# Patient Record
Sex: Female | Born: 1980 | Race: White | Hispanic: No | Marital: Married | Smoking: Never smoker
Health system: Southern US, Community
[De-identification: ages and names within clinical notes are randomized; demographics above are authoritative.]

## PROBLEM LIST (undated history)

## (undated) DIAGNOSIS — O26899 Other specified pregnancy related conditions, unspecified trimester: Secondary | ICD-10-CM

## (undated) DIAGNOSIS — R12 Heartburn: Secondary | ICD-10-CM

## (undated) DIAGNOSIS — Z789 Other specified health status: Secondary | ICD-10-CM

## (undated) HISTORY — PX: WISDOM TOOTH EXTRACTION: SHX21

---

## 2002-08-14 ENCOUNTER — Other Ambulatory Visit: Admission: RE | Admit: 2002-08-14 | Discharge: 2002-08-14 | Payer: Self-pay | Admitting: Family Medicine

## 2003-11-19 ENCOUNTER — Other Ambulatory Visit: Admission: RE | Admit: 2003-11-19 | Discharge: 2003-11-19 | Payer: Self-pay | Admitting: Family Medicine

## 2010-11-02 LAB — OB RESULTS CONSOLE GC/CHLAMYDIA
Chlamydia: NEGATIVE
Gonorrhea: NEGATIVE

## 2010-11-02 LAB — OB RESULTS CONSOLE RUBELLA ANTIBODY, IGM: Rubella: IMMUNE

## 2010-11-02 LAB — OB RESULTS CONSOLE ABO/RH

## 2010-11-02 LAB — OB RESULTS CONSOLE ANTIBODY SCREEN: Antibody Screen: NEGATIVE

## 2011-05-08 LAB — OB RESULTS CONSOLE GBS: GBS: POSITIVE

## 2011-05-16 ENCOUNTER — Other Ambulatory Visit: Payer: Self-pay | Admitting: Obstetrics and Gynecology

## 2011-05-16 DIAGNOSIS — N63 Unspecified lump in unspecified breast: Secondary | ICD-10-CM

## 2011-05-17 ENCOUNTER — Ambulatory Visit
Admission: RE | Admit: 2011-05-17 | Discharge: 2011-05-17 | Disposition: A | Discharge status: Home / Self Care | Payer: BC Managed Care – PPO | Source: Ambulatory Visit | Attending: Obstetrics and Gynecology | Admitting: Obstetrics and Gynecology

## 2011-05-17 DIAGNOSIS — N63 Unspecified lump in unspecified breast: Secondary | ICD-10-CM

## 2011-06-07 ENCOUNTER — Inpatient Hospital Stay (HOSPITAL_COMMUNITY)
Admission: AD | Admit: 2011-06-07 | Discharge: 2011-06-10 | DRG: 371 | Disposition: A | Discharge status: Home / Self Care | Payer: BC Managed Care – PPO | Source: Ambulatory Visit | Attending: Obstetrics and Gynecology | Admitting: Obstetrics and Gynecology

## 2011-06-07 ENCOUNTER — Encounter (HOSPITAL_COMMUNITY): Payer: Self-pay | Admitting: *Deleted

## 2011-06-07 DIAGNOSIS — O4290 Premature rupture of membranes, unspecified as to length of time between rupture and onset of labor, unspecified weeks of gestation: Secondary | ICD-10-CM | POA: Diagnosis present

## 2011-06-07 DIAGNOSIS — Z22330 Carrier of Group B streptococcus: Secondary | ICD-10-CM

## 2011-06-07 DIAGNOSIS — Z98891 History of uterine scar from previous surgery: Secondary | ICD-10-CM

## 2011-06-07 DIAGNOSIS — O99892 Other specified diseases and conditions complicating childbirth: Secondary | ICD-10-CM | POA: Diagnosis present

## 2011-06-07 LAB — CBC
HCT: 33.8 % — ABNORMAL LOW (ref 36.0–46.0)
RDW: 14.7 % (ref 11.5–15.5)
WBC: 14.4 10*3/uL — ABNORMAL HIGH (ref 4.0–10.5)

## 2011-06-07 MED ORDER — LACTATED RINGERS IV SOLN
500.0000 mL | Freq: Once | INTRAVENOUS | Status: AC
  Administered 2011-06-07: 500 mL via INTRAVENOUS

## 2011-06-07 MED ORDER — CEFAZOLIN SODIUM 1-5 GM-% IV SOLN
1.0000 g | Freq: Three times a day (TID) | INTRAVENOUS | Status: DC
  Administered 2011-06-07 (×2): 1 g via INTRAVENOUS
  Filled 2011-06-07 (×4): qty 50

## 2011-06-07 MED ORDER — OXYCODONE-ACETAMINOPHEN 5-325 MG PO TABS: 1.0000 | ORAL_TABLET | ORAL | Status: DC | PRN

## 2011-06-07 MED ORDER — OXYTOCIN BOLUS FROM INFUSION
500.0000 mL | Freq: Once | INTRAVENOUS | Status: DC
  Filled 2011-06-07: qty 500

## 2011-06-07 MED ORDER — FENTANYL 2.5 MCG/ML BUPIVACAINE 1/10 % EPIDURAL INFUSION (WH - ANES)
INTRAMUSCULAR | Status: DC | PRN
  Administered 2011-06-07
  Administered 2011-06-07: 14 mL/h via EPIDURAL

## 2011-06-07 MED ORDER — IBUPROFEN 600 MG PO TABS: 600.0000 mg | ORAL_TABLET | Freq: Four times a day (QID) | ORAL | Status: DC | PRN

## 2011-06-07 MED ORDER — OXYTOCIN 20 UNITS IN LACTATED RINGERS INFUSION - SIMPLE: 125.0000 mL/h | Freq: Once | INTRAVENOUS | Status: DC

## 2011-06-07 MED ORDER — FENTANYL 2.5 MCG/ML BUPIVACAINE 1/10 % EPIDURAL INFUSION (WH - ANES)
14.0000 mL/h | INTRAMUSCULAR | Status: DC
  Administered 2011-06-07: 14 mL/h via EPIDURAL
  Filled 2011-06-07 (×3): qty 60

## 2011-06-07 MED ORDER — TERBUTALINE SULFATE 1 MG/ML IJ SOLN: 0.2500 mg | Freq: Once | INTRAMUSCULAR | Status: AC | PRN

## 2011-06-07 MED ORDER — CEFAZOLIN SODIUM-DEXTROSE 2-3 GM-% IV SOLR
2.0000 g | Freq: Once | INTRAVENOUS | Status: AC
  Administered 2011-06-07: 2 g via INTRAVENOUS
  Filled 2011-06-07: qty 50

## 2011-06-07 MED ORDER — BUTORPHANOL TARTRATE 2 MG/ML IJ SOLN
1.0000 mg | Freq: Once | INTRAMUSCULAR | Status: AC
  Administered 2011-06-07: 1 mg via INTRAVENOUS
  Filled 2011-06-07: qty 1

## 2011-06-07 MED ORDER — PHENYLEPHRINE 40 MCG/ML (10ML) SYRINGE FOR IV PUSH (FOR BLOOD PRESSURE SUPPORT)
80.0000 ug | PREFILLED_SYRINGE | INTRAVENOUS | Status: DC | PRN
Start: 2011-06-07 — End: 2011-06-08
  Administered 2011-06-07 (×2): 80 ug via INTRAVENOUS
  Filled 2011-06-07: qty 5

## 2011-06-07 MED ORDER — EPHEDRINE 5 MG/ML INJ
10.0000 mg | INTRAVENOUS | Status: DC | PRN
Start: 2011-06-07 — End: 2011-06-08
  Administered 2011-06-07: 10 mg via INTRAVENOUS
  Filled 2011-06-07: qty 4

## 2011-06-07 MED ORDER — DIPHENHYDRAMINE HCL 50 MG/ML IJ SOLN: 12.5000 mg | INTRAMUSCULAR | Status: DC | PRN

## 2011-06-07 MED ORDER — OXYTOCIN 20 UNITS IN LACTATED RINGERS INFUSION - SIMPLE: 1.0000 m[IU]/min | INTRAVENOUS | Status: DC

## 2011-06-07 MED ORDER — EPHEDRINE 5 MG/ML INJ: 10.0000 mg | INTRAVENOUS | Status: DC | PRN

## 2011-06-07 MED ORDER — LIDOCAINE HCL (PF) 1 % IJ SOLN: 30.0000 mL | INTRAMUSCULAR | Status: DC | PRN

## 2011-06-07 MED ORDER — LACTATED RINGERS IV SOLN
500.0000 mL | INTRAVENOUS | Status: DC | PRN
  Administered 2011-06-07: 500 mL via INTRAVENOUS
  Administered 2011-06-07 (×2): 300 mL via INTRAVENOUS
  Administered 2011-06-07: 500 mL via INTRAVENOUS

## 2011-06-07 MED ORDER — OXYTOCIN 20 UNITS IN LACTATED RINGERS INFUSION - SIMPLE
1.0000 m[IU]/min | INTRAVENOUS | Status: DC
  Administered 2011-06-07: 19 m[IU]/min via INTRAVENOUS
  Administered 2011-06-07: 1 m[IU]/min via INTRAVENOUS
  Filled 2011-06-07: qty 1000

## 2011-06-07 MED ORDER — ACETAMINOPHEN 325 MG PO TABS: 650.0000 mg | ORAL_TABLET | ORAL | Status: DC | PRN

## 2011-06-07 MED ORDER — LACTATED RINGERS IV SOLN
INTRAVENOUS | Status: DC
  Administered 2011-06-07 – 2011-06-08 (×4): via INTRAVENOUS

## 2011-06-07 MED ORDER — SODIUM BICARBONATE 8.4 % IV SOLN
INTRAVENOUS | Status: DC | PRN
  Administered 2011-06-07: 4 mL via EPIDURAL

## 2011-06-07 MED ORDER — CITRIC ACID-SODIUM CITRATE 334-500 MG/5ML PO SOLN
30.0000 mL | ORAL | Status: DC | PRN
  Administered 2011-06-08: 30 mL via ORAL
  Filled 2011-06-07: qty 15

## 2011-06-07 MED ORDER — ONDANSETRON HCL 4 MG/2ML IJ SOLN: 4.0000 mg | Freq: Four times a day (QID) | INTRAMUSCULAR | Status: DC | PRN

## 2011-06-07 MED ORDER — PHENYLEPHRINE 40 MCG/ML (10ML) SYRINGE FOR IV PUSH (FOR BLOOD PRESSURE SUPPORT): 80.0000 ug | PREFILLED_SYRINGE | INTRAVENOUS | Status: DC | PRN

## 2011-06-07 NOTE — Progress Notes (Signed)
Order to restart Pitocin at 75milliunit/min

## 2011-06-07 NOTE — Progress Notes (Signed)
Patient ID: Tammie Atkinson, female   DOB: 01-18-1980, 31 y.o.   MRN: [id number] Without pitocin the FHR looks normal but contractions are 6 minutes apart. Will restart pitocin at 1 mu/minute.

## 2011-06-07 NOTE — Progress Notes (Signed)
Patient ID: Tammie Atkinson, female   DOB: Sep 20, 1980, 31 y.o.   MRN: [id number] She has begun to have decelerations again Pitocin has been stopped Cervix 2 cm 80 % effaced and the vertex is at -1/-2 station The failure of the baby to tolerate good contractions is making C section even more likely.

## 2011-06-07 NOTE — H&P (Signed)
NAMESTINA, GANE              ACCOUNT NO.:  [id number]  MEDICAL RECORD NO.:  [id number]  LOCATION:  9171                          FACILITY:  [facility]  PHYSICIAN:  Malachi Pro. Ambrose Mantle, M.D. DATE OF BIRTH:  Jan 10, 1980  DATE OF ADMISSION:  06/07/2011 DATE OF DISCHARGE:                             HISTORY & PHYSICAL   PRESENT ILLNESS:  This is a 31 year old white female, para 0, gravida 1, EDC June 06, 2011 by an ultrasound done at 7 weeks and 5 days on October 23, 2010.  Blood group and type B positive, negative antibody.  Pap smear normal.  Rubella immune.  RPR nonreactive.  Urine culture negative.  Hepatitis B surface antigen negative, HIV negative, GC and Chlamydia negative.  1 hour Glucola normal.  Group B strep positive. This patient had a relatively benign prenatal course and had a spontaneous rupture of the membranes at 3 a.m. on June 07, 2011.  She came to the hospital.  Rupture of membranes was confirmed and she was admitted.  Ultrasound on October 23, 2010, showed an average ultrasound age of 7 weeks and 5 days with an [facility] of June 06, 2011.  Ultrasound on January 08, 2011, gave her an average ultrasound age of 29 weeks and 3 days with an [facility] of June 08, 2011.  Ultrasound was normal.  As stated the prenatal course was uncomplicated until spontaneous rupture of membranes today.  PAST MEDICAL HISTORY:  Reveals medical history of allergic rhinitis due to pollen, dry skin, fractured ankle at age 37, and a history of gonorrhea.  SURGICAL HISTORY:  Wisdom teeth extraction at age 9.  ALLERGIES:  PENICILLIN as an infant gave hives.  She has no food allergies and no latex allergy.  FAMILY HISTORY:  Mother has a history of depression and arthritis. Maternal grandmother has arthritis, colon cancer, diabetes, hypertension.  Her paternal grandfather has bone cancer and lung cancer. Her brother has asthma.  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  She denies tobacco, alcohol, and illicit substance abuse.  PHYSICAL EXAMINATION:  VITAL SIGNS:  On admission, temperature is 98.4, pulse 93, respirations 20, blood pressure 111/59. HEART:  Normal size and sounds.  No murmurs. LUNGS:  Clear to auscultation. ABDOMEN:  Soft and nontender.  Fundal height on May 24, 2011, was 37 cm. Fetal heart tones are normal.  The cervix is long and closed. Presenting part is -4 thought to be vertex. Rupture of membranes has been confirmed.  ADMITTING IMPRESSION:  Intrauterine pregnancy at 40 weeks and 1 day with premature rupture of the membranes, and positive group B strep.  The patient has been started on Pitocin and Ancef for the positive group B strep.  She has been informed that everything will be done to try to effect a vaginal delivery, but the baby feels fairly large, the cervix is extremely unfavorable, and her pelvis seems somewhat narrow.  At the present, Pitocin is at 3 milliunits a minute, her contractions are every 3-5 minutes.     Malachi Pro. Ambrose Mantle, M.D.     TFH/MEDQ  D:  06/07/2011  T:  06/07/2011  Job:  [id number]

## 2011-06-07 NOTE — Anesthesia Preprocedure Evaluation (Signed)

## 2011-06-07 NOTE — Progress Notes (Signed)
Patient ID: Tammie Atkinson, female   DOB: 01-20-1980, 31 y.o.   MRN: [id number] The RN stopped the pitocin because of some decelerations. I advised restarting it at 1/2 the prior dose. She began having some late decelerations at 6:48 PM. I examined her and found the cervix to be FT 70-80% effaced and the vertex at -1/-2 station. The pt had a deep deceleration that corrected with O2, position change and stopping the pitocin We will watch for progress without pitocin for now.

## 2011-06-07 NOTE — Anesthesia Procedure Notes (Signed)

## 2011-06-07 NOTE — MAU Note (Signed)
PT SAYS SHE WAS ASLEEP- BUT  AWOKE AT 0300-   FEELING WET- TO B-ROOM- FLUID CONTINUED TO TRICKLE OUT..   DENIES HSV AND MRSA.

## 2011-06-07 NOTE — Progress Notes (Signed)
Dr Ambrose Mantle phoned to check on pt. Updated on FHR, contraction pattern,temp and maternal HR. Order to restart Pitocin at 5 milliunits/min.

## 2011-06-08 ENCOUNTER — Encounter (HOSPITAL_COMMUNITY): Payer: Self-pay | Admitting: Anesthesiology

## 2011-06-08 ENCOUNTER — Inpatient Hospital Stay (HOSPITAL_COMMUNITY): Payer: BC Managed Care – PPO | Admitting: Anesthesiology

## 2011-06-08 ENCOUNTER — Encounter (HOSPITAL_COMMUNITY): Payer: Self-pay | Admitting: *Deleted

## 2011-06-08 ENCOUNTER — Encounter (HOSPITAL_COMMUNITY)
Admission: AD | Disposition: A | Discharge status: Home / Self Care | Payer: Self-pay | Source: Ambulatory Visit | Attending: Obstetrics and Gynecology

## 2011-06-08 SURGERY — Surgical Case
Anesthesia: Regional | Site: Abdomen | Wound class: Clean Contaminated

## 2011-06-08 MED ORDER — ACETAMINOPHEN 10 MG/ML IV SOLN
1000.0000 mg | Freq: Four times a day (QID) | INTRAVENOUS | Status: AC | PRN
  Filled 2011-06-08: qty 100

## 2011-06-08 MED ORDER — SODIUM CHLORIDE 0.9 % IV SOLN
1.0000 ug/kg/h | INTRAVENOUS | Status: DC | PRN
  Filled 2011-06-08: qty 2.5

## 2011-06-08 MED ORDER — CEFAZOLIN SODIUM-DEXTROSE 2-3 GM-% IV SOLR
2.0000 g | Freq: Three times a day (TID) | INTRAVENOUS | Status: AC
  Administered 2011-06-08: 2 g via INTRAVENOUS
  Filled 2011-06-08: qty 50

## 2011-06-08 MED ORDER — METOCLOPRAMIDE HCL 5 MG/ML IJ SOLN: 10.0000 mg | Freq: Three times a day (TID) | INTRAMUSCULAR | Status: DC | PRN

## 2011-06-08 MED ORDER — TETANUS-DIPHTH-ACELL PERTUSSIS 5-2.5-18.5 LF-MCG/0.5 IM SUSP: 0.5000 mL | Freq: Once | INTRAMUSCULAR | Status: DC

## 2011-06-08 MED ORDER — OXYTOCIN 10 UNIT/ML IJ SOLN
20.0000 [IU] | INTRAVENOUS | Status: DC | PRN
  Administered 2011-06-08: 20 [IU] via INTRAVENOUS

## 2011-06-08 MED ORDER — ONDANSETRON HCL 4 MG/2ML IJ SOLN: 4.0000 mg | INTRAMUSCULAR | Status: DC | PRN

## 2011-06-08 MED ORDER — MORPHINE SULFATE 0.5 MG/ML IJ SOLN
INTRAMUSCULAR | Status: AC
  Filled 2011-06-08: qty 10

## 2011-06-08 MED ORDER — OXYTOCIN 20 UNITS IN LACTATED RINGERS INFUSION - SIMPLE
INTRAVENOUS | Status: AC
  Administered 2011-06-08: 20 [IU]
  Filled 2011-06-08: qty 1000

## 2011-06-08 MED ORDER — OXYTOCIN 10 UNIT/ML IJ SOLN
INTRAMUSCULAR | Status: AC
  Filled 2011-06-08: qty 2

## 2011-06-08 MED ORDER — KETOROLAC TROMETHAMINE 30 MG/ML IJ SOLN
30.0000 mg | Freq: Four times a day (QID) | INTRAMUSCULAR | Status: AC | PRN
  Administered 2011-06-08: 30 mg via INTRAVENOUS

## 2011-06-08 MED ORDER — SIMETHICONE 80 MG PO CHEW
80.0000 mg | CHEWABLE_TABLET | Freq: Three times a day (TID) | ORAL | Status: DC
  Administered 2011-06-08 – 2011-06-10 (×9): 80 mg via ORAL

## 2011-06-08 MED ORDER — LACTATED RINGERS IV SOLN: INTRAVENOUS | Status: DC

## 2011-06-08 MED ORDER — DIPHENHYDRAMINE HCL 50 MG/ML IJ SOLN: 12.5000 mg | INTRAMUSCULAR | Status: DC | PRN

## 2011-06-08 MED ORDER — SODIUM BICARBONATE 8.4 % IV SOLN
INTRAVENOUS | Status: AC
  Filled 2011-06-08: qty 50

## 2011-06-08 MED ORDER — ONDANSETRON HCL 4 MG/2ML IJ SOLN: 4.0000 mg | Freq: Three times a day (TID) | INTRAMUSCULAR | Status: DC | PRN

## 2011-06-08 MED ORDER — LIDOCAINE-EPINEPHRINE 2 %-1:100000 IJ SOLN
INTRAMUSCULAR | Status: DC | PRN
  Administered 2011-06-08 (×3): 5 mL via INTRADERMAL

## 2011-06-08 MED ORDER — MEASLES, MUMPS & RUBELLA VAC ~~LOC~~ INJ
0.5000 mL | INJECTION | Freq: Once | SUBCUTANEOUS | Status: DC
  Filled 2011-06-08: qty 0.5

## 2011-06-08 MED ORDER — IBUPROFEN 600 MG PO TABS
600.0000 mg | ORAL_TABLET | Freq: Four times a day (QID) | ORAL | Status: DC
  Administered 2011-06-08 – 2011-06-10 (×8): 600 mg via ORAL
  Filled 2011-06-08 (×8): qty 1

## 2011-06-08 MED ORDER — MEPERIDINE HCL 25 MG/ML IJ SOLN: 6.2500 mg | INTRAMUSCULAR | Status: DC | PRN

## 2011-06-08 MED ORDER — ONDANSETRON HCL 4 MG/2ML IJ SOLN
INTRAMUSCULAR | Status: AC
  Filled 2011-06-08: qty 2

## 2011-06-08 MED ORDER — MORPHINE SULFATE (PF) 0.5 MG/ML IJ SOLN
INTRAMUSCULAR | Status: DC | PRN
  Administered 2011-06-08: 4 mg via EPIDURAL

## 2011-06-08 MED ORDER — WITCH HAZEL-GLYCERIN EX PADS: 1.0000 "application " | MEDICATED_PAD | CUTANEOUS | Status: DC | PRN

## 2011-06-08 MED ORDER — DIBUCAINE 1 % RE OINT: 1.0000 "application " | TOPICAL_OINTMENT | RECTAL | Status: DC | PRN

## 2011-06-08 MED ORDER — DIPHENHYDRAMINE HCL 50 MG/ML IJ SOLN: 25.0000 mg | INTRAMUSCULAR | Status: DC | PRN

## 2011-06-08 MED ORDER — ADULT MULTIVITAMIN W/MINERALS CH
1.0000 | ORAL_TABLET | Freq: Every day | ORAL | Status: DC
  Administered 2011-06-08 – 2011-06-10 (×3): 1 via ORAL
  Filled 2011-06-08 (×5): qty 1

## 2011-06-08 MED ORDER — MENTHOL 3 MG MT LOZG: 1.0000 | LOZENGE | OROMUCOSAL | Status: DC | PRN

## 2011-06-08 MED ORDER — ONDANSETRON HCL 4 MG PO TABS: 4.0000 mg | ORAL_TABLET | ORAL | Status: DC | PRN

## 2011-06-08 MED ORDER — OXYTOCIN 20 UNITS IN LACTATED RINGERS INFUSION - SIMPLE: 125.0000 mL/h | INTRAVENOUS | Status: AC

## 2011-06-08 MED ORDER — FENTANYL CITRATE 0.05 MG/ML IJ SOLN: 25.0000 ug | INTRAMUSCULAR | Status: DC | PRN

## 2011-06-08 MED ORDER — OXYCODONE-ACETAMINOPHEN 5-325 MG PO TABS
1.0000 | ORAL_TABLET | ORAL | Status: DC | PRN
  Administered 2011-06-09 – 2011-06-10 (×4): 1 via ORAL
  Filled 2011-06-08 (×4): qty 1

## 2011-06-08 MED ORDER — LIDOCAINE-EPINEPHRINE (PF) 2 %-1:200000 IJ SOLN
INTRAMUSCULAR | Status: AC
  Filled 2011-06-08: qty 20

## 2011-06-08 MED ORDER — OXYTOCIN 10 UNIT/ML IJ SOLN
20.0000 [IU] | INTRAMUSCULAR | Status: DC
  Filled 2011-06-08: qty 2

## 2011-06-08 MED ORDER — ONDANSETRON HCL 4 MG/2ML IJ SOLN
INTRAMUSCULAR | Status: DC | PRN
  Administered 2011-06-08: 2 mg via INTRAVENOUS

## 2011-06-08 MED ORDER — ZOLPIDEM TARTRATE 5 MG PO TABS: 5.0000 mg | ORAL_TABLET | Freq: Every evening | ORAL | Status: DC | PRN

## 2011-06-08 MED ORDER — SODIUM CHLORIDE 0.9 % IJ SOLN: 3.0000 mL | INTRAMUSCULAR | Status: DC | PRN

## 2011-06-08 MED ORDER — NALBUPHINE HCL 10 MG/ML IJ SOLN
5.0000 mg | INTRAMUSCULAR | Status: DC | PRN
  Filled 2011-06-08: qty 1

## 2011-06-08 MED ORDER — DIPHENHYDRAMINE HCL 25 MG PO CAPS: 25.0000 mg | ORAL_CAPSULE | ORAL | Status: DC | PRN

## 2011-06-08 MED ORDER — PROMETHAZINE HCL 25 MG/ML IJ SOLN: 6.2500 mg | INTRAMUSCULAR | Status: DC | PRN

## 2011-06-08 MED ORDER — CEFAZOLIN SODIUM 1-5 GM-% IV SOLN
1.0000 g | Freq: Three times a day (TID) | INTRAVENOUS | Status: AC
  Administered 2011-06-08: 1 g via INTRAVENOUS
  Filled 2011-06-08 (×2): qty 50

## 2011-06-08 MED ORDER — KETOROLAC TROMETHAMINE 30 MG/ML IJ SOLN: 30.0000 mg | Freq: Four times a day (QID) | INTRAMUSCULAR | Status: AC | PRN

## 2011-06-08 MED ORDER — LANOLIN HYDROUS EX OINT: 1.0000 "application " | TOPICAL_OINTMENT | CUTANEOUS | Status: DC | PRN

## 2011-06-08 MED ORDER — SENNOSIDES-DOCUSATE SODIUM 8.6-50 MG PO TABS
2.0000 | ORAL_TABLET | Freq: Every day | ORAL | Status: DC
  Administered 2011-06-09: 2 via ORAL

## 2011-06-08 MED ORDER — NALOXONE HCL 0.4 MG/ML IJ SOLN: 0.4000 mg | INTRAMUSCULAR | Status: DC | PRN

## 2011-06-08 MED ORDER — ACETAMINOPHEN 325 MG PO TABS: 325.0000 mg | ORAL_TABLET | ORAL | Status: DC | PRN

## 2011-06-08 MED ORDER — PHENYLEPHRINE HCL 10 MG/ML IJ SOLN
INTRAMUSCULAR | Status: DC | PRN
  Administered 2011-06-08: 80 ug via INTRAVENOUS
  Administered 2011-06-08: 120 ug via INTRAVENOUS
  Administered 2011-06-08: 80 ug via INTRAVENOUS
  Administered 2011-06-08: 100 ug via INTRAVENOUS
  Administered 2011-06-08: 80 ug via INTRAVENOUS
  Administered 2011-06-08: 120 ug via INTRAVENOUS

## 2011-06-08 MED ORDER — SCOPOLAMINE 1 MG/3DAYS TD PT72
1.0000 | MEDICATED_PATCH | Freq: Once | TRANSDERMAL | Status: DC
  Administered 2011-06-08: 1.5 mg via TRANSDERMAL

## 2011-06-08 MED ORDER — LACTATED RINGERS IV SOLN
INTRAVENOUS | Status: DC | PRN
  Administered 2011-06-08 (×2): via INTRAVENOUS

## 2011-06-08 MED ORDER — MIDAZOLAM HCL 2 MG/2ML IJ SOLN: 0.5000 mg | Freq: Once | INTRAMUSCULAR | Status: DC | PRN

## 2011-06-08 MED ORDER — DIPHENHYDRAMINE HCL 25 MG PO CAPS: 25.0000 mg | ORAL_CAPSULE | Freq: Four times a day (QID) | ORAL | Status: DC | PRN

## 2011-06-08 MED ORDER — SIMETHICONE 80 MG PO CHEW: 80.0000 mg | CHEWABLE_TABLET | ORAL | Status: DC | PRN

## 2011-06-08 SURGICAL SUPPLY — 31 items
CLOTH BEACON ORANGE TIMEOUT ST (SAFETY) ×2 IMPLANT
CONTAINER PREFILL 10% NBF 15ML (MISCELLANEOUS) IMPLANT
DRESSING TELFA 8X3 (GAUZE/BANDAGES/DRESSINGS) IMPLANT
DRSG COVADERM 4X10 (GAUZE/BANDAGES/DRESSINGS) ×2 IMPLANT
DRSG VASELINE 3X18 (GAUZE/BANDAGES/DRESSINGS) ×2 IMPLANT
ELECT REM PT RETURN 9FT ADLT (ELECTROSURGICAL) ×2
ELECTRODE REM PT RTRN 9FT ADLT (ELECTROSURGICAL) ×1 IMPLANT
EXTRACTOR VACUUM KIWI (MISCELLANEOUS) IMPLANT
EXTRACTOR VACUUM M CUP 4 TUBE (SUCTIONS) IMPLANT
GAUZE SPONGE 4X4 12PLY STRL LF (GAUZE/BANDAGES/DRESSINGS) ×4 IMPLANT
GLOVE BIO SURGEON STRL SZ7.5 (GLOVE) ×4 IMPLANT
GOWN PREVENTION PLUS LG XLONG (DISPOSABLE) ×4 IMPLANT
GOWN PREVENTION PLUS XLARGE (GOWN DISPOSABLE) ×2 IMPLANT
KIT ABG SYR 3ML LUER SLIP (SYRINGE) IMPLANT
NEEDLE HYPO 25X5/8 SAFETYGLIDE (NEEDLE) IMPLANT
NS IRRIG 1000ML POUR BTL (IV SOLUTION) ×2 IMPLANT
PACK C SECTION WH (CUSTOM PROCEDURE TRAY) ×2 IMPLANT
PAD ABD 7.5X8 STRL (GAUZE/BANDAGES/DRESSINGS) IMPLANT
RTRCTR C-SECT PINK 25CM LRG (MISCELLANEOUS) IMPLANT
SLEEVE SCD COMPRESS KNEE MED (MISCELLANEOUS) IMPLANT
STAPLER VISISTAT 35W (STAPLE) ×2 IMPLANT
SUT PLAIN 0 NONE (SUTURE) IMPLANT
SUT VIC AB 0 CT1 36 (SUTURE) ×14 IMPLANT
SUT VIC AB 3-0 CTX 36 (SUTURE) ×2 IMPLANT
SUT VIC AB 3-0 SH 27 (SUTURE)
SUT VIC AB 3-0 SH 27X BRD (SUTURE) IMPLANT
SUT VIC AB 4-0 KS 27 (SUTURE) IMPLANT
SUT VICRYL 0 TIES 12 18 (SUTURE) IMPLANT
TOWEL OR 17X24 6PK STRL BLUE (TOWEL DISPOSABLE) ×6 IMPLANT
TRAY FOLEY CATH 14FR (SET/KITS/TRAYS/PACK) IMPLANT
WATER STERILE IRR 1000ML POUR (IV SOLUTION) ×2 IMPLANT

## 2011-06-08 NOTE — Anesthesia Postprocedure Evaluation (Signed)
  Anesthesia Post-op Note  Patient: Tammie Atkinson  Procedure(s) Performed: Procedure(s) (LRB): CESAREAN SECTION (N/A)  Patient Location: Mother/Baby  Anesthesia Type: Epidural  Level of Consciousness: awake  Airway and Oxygen Therapy: Patient Spontanous Breathing  Post-op Pain: mild  Post-op Assessment: Patient's Cardiovascular Status Stable and Respiratory Function Stable  Post-op Vital Signs: stable  Complications: No apparent anesthesia complications 

## 2011-06-08 NOTE — Progress Notes (Signed)
Patient ID: Tammie Atkinson, female   DOB: 06-Feb-1980, 31 y.o.   MRN: [id number] Pitocin is at 1 mu/ minute and the contractions are infrequent The cervix is 3+ cm 100 % effaced and the vertex is at -1 station.We will begin to increase rhe pitocin to see if the baby will tolerate the contractions.

## 2011-06-08 NOTE — Anesthesia Postprocedure Evaluation (Signed)
Anesthesia Post Note  Patient: Tammie Atkinson  Procedure(s) Performed: Procedure(s) (LRB): CESAREAN SECTION (N/A)  Anesthesia type: Epidural  Patient location: PACU  Post pain: Pain level controlled  Post assessment: Post-op Vital signs reviewed  Last Vitals:  Filed Vitals:   06/08/11 0500  BP: 104/61  Pulse: 103  Temp: 37.7 C  Resp: 25    Post vital signs: Reviewed  Level of consciousness: awake  Complications: No apparent anesthesia complications

## 2011-06-08 NOTE — Transfer of Care (Signed)
Immediate Anesthesia Transfer of Care Note  Patient: Tammie Atkinson  Procedure(s) Performed: Procedure(s) (LRB): CESAREAN SECTION (N/A)  Patient Location: PACU  Anesthesia Type: Epidural  Level of Consciousness: awake, alert  and oriented  Airway & Oxygen Therapy: Patient Spontanous Breathing  Post-op Assessment: Report given to PACU RN  Post vital signs: Reviewed  Complications: No apparent anesthesia complications

## 2011-06-08 NOTE — Progress Notes (Signed)
Patient ID: Tammie Atkinson, female   DOB: 1980/10/28, 31 y.o.   MRN: [id number] DOS afebrile BP normal no complaints.

## 2011-06-08 NOTE — Op Note (Signed)
Operative note on Tammie Atkinson:  Date of the operation: 06/08/2011  Preoperative diagnosis intrauterine pregnancy 40+ weeks, fetal intolerance of labor  Postoperative diagnosis: Same   Operation: Low transverse cervical C-section  Operator: Ameirah Khatoon  Anesthesia: Epidural Dr. Brayton Caves  The patient was brought to the operating room. The epidural had been boosted. Anesthesia was confirmed. A timeout was done. The lower abdomen was prepped with Betadine solution and draped as a sterile field. A Foley catheter was indwelling. The fetal scalp electrode had been removed just prior to prepping. The area was draped as a sterile field and anesthesia was confirmed by pinching the lower abdomen with an Allis clamp. A transverse incision was made and carried in layers through the skin subcutaneous tissue and fascia. Several bleeders were ligated with the Bovie and one bleeder had to be clamped and bovied. The fascia was incised laterally and the rectus muscle was split in the midline and the peritoneum was opened vertically.An Alexis retractor was placed to expose the lower uterine segment. A short transverse incision was made through the superficial layers of the myometrium. I entered the amniotic sac with my finger. The incision was enlarged by pulling superiorly and inferiorly on the incision. The vertex was OP. The vertex was easily delivered through the incisional opening, the nose and pharynx were suctioned with the bulb, and the remainder of the baby was delivered easily. The infant was female and she cried just after birth. The infant's weight is pending and I do not have the Apgars. Dr. Marthann Schiller, a neonatologist, took care of the baby Routine cord blood studies were obtained and the placenta was removed intact. The inside of the uterus was inspected and found to be free of any debris. The uterine incision was then closed in 2 layers using a running lock suture of 0 Vicryl the first layer, and  nonlocking suture of the same material on the second layer. The uterus both tubes and ovaries appeared normal. Hemostasis was adequate and liberal irrigation confirmed hemostasis and the gutters were free of any blood. The retractor was removed and the abdominal wall was closed in layers using interrupted sutures of 0 Vicryl on rectus muscle and peritoneum in one layer,2 running sutures of 0 Vicryl on the fascia, a running 3-0 Vicryl in the subcutaneous tissue, and staples on the skin. The patient seemed to tolerate the procedure well. Blood loss was estimated at 1000 cc. Sponge and needle counts were correct and she was returned to recovery in satisfactory condition

## 2011-06-08 NOTE — Addendum Note (Signed)
Addendum  created 06/08/11 1432 by Renford Dills, CRNA   Modules edited:Notes Section

## 2011-06-08 NOTE — Progress Notes (Signed)
Patient ID: Tammie Atkinson, female   DOB: 02-Mar-1980, 31 y.o.   MRN: [id number] The fetus continues to be intolerant of labor. Even with 3 mu/ minute of pitocin decelerations recurred and the cervix is unchanged Wiill proceed with c section

## 2011-06-09 LAB — CBC
MCV: 84.2 fL (ref 78.0–100.0)
Platelets: 202 10*3/uL (ref 150–400)
RDW: 14.9 % (ref 11.5–15.5)
WBC: 15.3 10*3/uL — ABNORMAL HIGH (ref 4.0–10.5)

## 2011-06-09 NOTE — Addendum Note (Signed)
Addendum  created 06/09/11 0806 by Renford Dills, CRNA   Modules edited:Notes Section

## 2011-06-09 NOTE — Anesthesia Postprocedure Evaluation (Signed)
  Anesthesia Post-op Note  Patient: Tammie Atkinson  Procedure(s) Performed: Procedure(s) (LRB): CESAREAN SECTION (N/A)  Patient Location: Mother/Baby  Anesthesia Type: Epidural  Level of Consciousness: awake  Airway and Oxygen Therapy: Patient Spontanous Breathing  Post-op Pain: mild  Post-op Assessment: Patient's Cardiovascular Status Stable and Respiratory Function Stable  Post-op Vital Signs: stable  Complications: No apparent anesthesia complications

## 2011-06-09 NOTE — Progress Notes (Signed)
Subjective: Postpartum Day 1 Cesarean Delivery Patient reports tolerating PO, + flatus and no problems voiding.    Objective: Vital signs in last 24 hours: Temp:  [98 F (36.7 C)-99.4 F (37.4 C)] 98 F (36.7 C) (06/08 0211) Pulse Rate:  [87-107] 87  (06/08 0211) Resp:  [17-20] 18  (06/08 0211) BP: (91-117)/(54-69) 105/61 mmHg (06/08 0211) SpO2:  [93 %-97 %] 94 % (06/07 2150)  Physical Exam:  General: alert and cooperative Lochia: appropriate Uterine Fundus: firm Incision: healing well    Basename 06/09/11 0530 06/07/11 0600  HGB 9.2* 10.4*  HCT 30.4* 33.8*    Assessment/Plan: Status post Cesarean section. Doing well postoperatively.  Continue current care.  Oliver Pila 06/09/2011, 9:43 AM

## 2011-06-10 ENCOUNTER — Encounter (HOSPITAL_COMMUNITY): Payer: Self-pay | Admitting: Obstetrics and Gynecology

## 2011-06-10 MED ORDER — IBUPROFEN 600 MG PO TABS: 600.0000 mg | ORAL_TABLET | Freq: Four times a day (QID) | ORAL | Status: AC

## 2011-06-10 MED ORDER — OXYCODONE-ACETAMINOPHEN 5-325 MG PO TABS: 1.0000 | ORAL_TABLET | ORAL | Status: AC | PRN

## 2011-06-10 NOTE — Discharge Summary (Signed)
Obstetric Discharge Summary Reason for Admission: rupture of membranes Prenatal Procedures: none Intrapartum Procedures: cesarean: low cervical, transverse Postpartum Procedures: none Complications-Operative and Postpartum: none Hemoglobin  Date Value Range Status  06/09/2011 9.2* 12.0-15.0 (g/dL) Final     HCT  Date Value Range Status  06/09/2011 30.4* 36.0-46.0 (%) Final    Physical Exam:  General: alert and cooperative Lochia: appropriate Uterine Fundus: firm Incision: healing well, no significant erythema   Discharge Diagnoses: Term Pregnancy-delivered                                         Non-reassuring FHR  Discharge Information: Date: 06/10/2011 Activity: pelvic rest Diet: routine Medications: Ibuprofen and Percocet Condition: improved Instructions: refer to practice specific booklet Discharge to: home   Newborn Data: Live born female  Birth Weight: 7 lb 13.9 oz (3569 g) APGAR: 9, 9  Home with mother.  Oliver Pila 06/10/2011, 10:07 AM

## 2011-06-10 NOTE — Progress Notes (Signed)
Subjective: Postpartum Day 2 Cesarean Delivery Patient reports tolerating PO, + flatus and no problems voiding.  Requests early d/c  Objective: Vital signs in last 24 hours: Temp:  [98.1 F (36.7 C)-98.4 F (36.9 C)] 98.1 F (36.7 C) (06/09 0625) Pulse Rate:  [87-97] 87  (06/09 0625) Resp:  [18] 18  (06/09 0625) BP: (109-117)/(73-76) 109/76 mmHg (06/09 1610)  Physical Exam:  General: alert and cooperative Lochia: appropriate Uterine Fundus: firm Incision: healing well, no significant erythema, staples intact    Basename 06/09/11 0530  HGB 9.2*  HCT 30.4*    Assessment/Plan: Status post Cesarean section. Doing well postoperatively.  D/c home F/u office in 3-4 days for staple removal Motrin and percocet  Merlene Dante W 06/10/2011, 10:03 AM

## 2012-03-12 ENCOUNTER — Ambulatory Visit (INDEPENDENT_AMBULATORY_CARE_PROVIDER_SITE_OTHER): Payer: BC Managed Care – PPO | Admitting: Family Medicine

## 2012-03-12 VITALS — BP 108/82 | HR 81 | Temp 98.1°F | Resp 16 | Ht 64.0 in | Wt 144.0 lb

## 2012-03-12 DIAGNOSIS — R0981 Nasal congestion: Secondary | ICD-10-CM

## 2012-03-12 DIAGNOSIS — J3489 Other specified disorders of nose and nasal sinuses: Secondary | ICD-10-CM

## 2012-03-12 DIAGNOSIS — R52 Pain, unspecified: Secondary | ICD-10-CM

## 2012-03-12 DIAGNOSIS — J029 Acute pharyngitis, unspecified: Secondary | ICD-10-CM

## 2012-03-12 LAB — POCT RAPID STREP A (OFFICE): Rapid Strep A Screen: NEGATIVE

## 2012-03-12 LAB — POCT INFLUENZA A/B: Influenza B, POC: NEGATIVE

## 2012-03-12 NOTE — Addendum Note (Signed)
Addended by: Eddie Candle on: 03/12/2012 03:19 PM   Modules accepted: Orders

## 2012-03-12 NOTE — Progress Notes (Signed)
[facility] [address], [place] [address] [phone]  Date:  03/12/2012   Name:  Tammie Atkinson   DOB:  [birth year]   MRN:  [id number]  PCP:  No primary provider on file.    Chief Complaint: Sore Throat and Headache   History of Present Illness:  Tammie Atkinson is a 32 y.o. very pleasant female patient who presents with the following:  She is here with a sore throat, stuffy nose and body aches over the last 24 hours or so.   She does have a cough.  She has not noted a fever.  No GI symptoms She does have a runny and stuffy nose, and some sneezing.  She does have an earache  She delivered a daughter last summer- she is still BF her daughter. 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  She and her daughter both had their flu shots this year.  She is generally healthy and does not have any chronic health problems.    There is no problem list on file for this patient.   History reviewed. No pertinent past medical history.  Past Surgical History  Procedure Laterality Date  . Wisdom tooth extraction    . Cesarean section  06/08/2011    Procedure: CESAREAN SECTION;  Surgeon: Bing Plume, MD;  Location: [facility];  Service: Gynecology;  Laterality: N/A;    History  Substance Use Topics  . Smoking status: Never Smoker   . Smokeless tobacco: Never Used  . Alcohol Use: No    Family History  Problem Relation Age of Onset  . Cancer Maternal Grandmother   . Diabetes Maternal Grandmother   . Cancer Paternal Grandfather     Allergies  Allergen Reactions  . Penicillins     SAYS TOOK AS A CHILD     Medication list has been reviewed and updated.  Current Outpatient Prescriptions on File Prior to Visit  Medication Sig Dispense Refill  . calcium carbonate (TUMS - DOSED IN MG ELEMENTAL CALCIUM) 500 MG chewable tablet Chew 1 tablet by mouth daily. For heartburn.      . Multiple Vitamin (MULTIVITAMIN) tablet Take 1  tablet by mouth daily.       No current facility-administered medications on file prior to visit.    Review of Systems:  As per HPI- otherwise negative.   Physical Examination: Filed Vitals:   03/12/12 1256  BP: 108/82  Pulse: 81  Temp: 98.1 F (36.7 C)  Resp: 16   Filed Vitals:   03/12/12 1256  Height: 5\' 4"  (1.626 m)  Weight: 144 lb (65.318 kg)   Body mass index is 24.71 kg/(m^2). Ideal Body Weight: Weight in (lb) to have BMI = 25: 145.3  GEN: WDWN, NAD, Non-toxic, A & O x 3 HEENT: Atraumatic, Normocephalic. Neck supple. No masses, No LAD.  Bilateral TM wnl, oropharynx normal.  PEERL,EOMI.  Nasal cavity is slightly congested.  Ears and Nose: No external deformity. CV: RRR, No M/G/R. No JVD. No thrill. No extra heart sounds. PULM: CTA B, no wheezes, crackles, rhonchi. No retractions. No resp. distress. No accessory muscle use. ABD: S, NT, ND EXTR: No c/c/e NEURO Normal gait.  PSYCH: Normally interactive. Conversant. Not depressed or anxious appearing.  Calm demeanor.   Results for orders placed in visit on 03/12/12  POCT INFLUENZA A/B      Result Value Range   Influenza A, POC Negative  Influenza B, POC Negative    POCT RAPID STREP A (OFFICE)      Result Value Range   Rapid Strep A Screen Negative  Negative     Assessment and Plan: Acute pharyngitis - Plan: POCT rapid strep A  Nasal congestion - Plan: POCT Influenza A/B  Body aches - Plan: POCT Influenza A/B  Likely viral URI - gave medications for pregnancy/ breastfeeding handout from [facility] for her review.  Encouraged rest and plenty of fluids, may use chloraseptic throat spray for her ST.  She will let us know if not better in the next few days- Sooner if worse.   Offered her a mask to wear while around her child.    Abbe Amsterdam, MD

## 2012-03-12 NOTE — Patient Instructions (Addendum)
Rest and drink plenty of fluids- let me know if not better in the next few days.  Let me know sooner if worse.  Keep an eye on your temperature.

## 2012-03-14 LAB — CULTURE, GROUP A STREP: Organism ID, Bacteria: NORMAL

## 2013-12-16 LAB — OB RESULTS CONSOLE HIV ANTIBODY (ROUTINE TESTING): HIV: NONREACTIVE

## 2013-12-16 LAB — OB RESULTS CONSOLE RPR: RPR: NONREACTIVE

## 2013-12-16 LAB — OB RESULTS CONSOLE RUBELLA ANTIBODY, IGM: RUBELLA: IMMUNE

## 2013-12-16 LAB — OB RESULTS CONSOLE ABO/RH: RH TYPE: POSITIVE

## 2013-12-16 LAB — OB RESULTS CONSOLE HEPATITIS B SURFACE ANTIGEN: HEP B S AG: NEGATIVE

## 2013-12-16 LAB — OB RESULTS CONSOLE ANTIBODY SCREEN: ANTIBODY SCREEN: NEGATIVE

## 2014-06-30 ENCOUNTER — Encounter (HOSPITAL_COMMUNITY): Payer: Self-pay

## 2014-07-01 ENCOUNTER — Encounter (HOSPITAL_COMMUNITY)
Admission: RE | Admit: 2014-07-01 | Discharge: 2014-07-01 | Disposition: A | Discharge status: Home / Self Care | Payer: BC Managed Care – PPO | Source: Ambulatory Visit | Attending: Obstetrics and Gynecology | Admitting: Obstetrics and Gynecology

## 2014-07-01 ENCOUNTER — Encounter (HOSPITAL_COMMUNITY): Payer: Self-pay

## 2014-07-01 DIAGNOSIS — Z01818 Encounter for other preprocedural examination: Secondary | ICD-10-CM | POA: Diagnosis not present

## 2014-07-01 HISTORY — DX: Other specified pregnancy related conditions, unspecified trimester: R12

## 2014-07-01 HISTORY — DX: Other specified health status: Z78.9

## 2014-07-01 HISTORY — DX: Other specified pregnancy related conditions, unspecified trimester: O26.899

## 2014-07-01 LAB — CBC
HEMATOCRIT: 39 % (ref 36.0–46.0)
Hemoglobin: 12.5 g/dL (ref 12.0–15.0)
MCH: 28.5 pg (ref 26.0–34.0)
MCHC: 32.1 g/dL (ref 30.0–36.0)
MCV: 89 fL (ref 78.0–100.0)
PLATELETS: 157 10*3/uL (ref 150–400)
RBC: 4.38 MIL/uL (ref 3.87–5.11)
RDW: 16 % — ABNORMAL HIGH (ref 11.5–15.5)
WBC: 12.6 10*3/uL — ABNORMAL HIGH (ref 4.0–10.5)

## 2014-07-01 LAB — TYPE AND SCREEN
ABO/RH(D): B POS
Antibody Screen: NEGATIVE

## 2014-07-01 LAB — ABO/RH: ABO/RH(D): B POS

## 2014-07-01 NOTE — Patient Instructions (Signed)
Your procedure is scheduled on:07/06/14  Enter through the Main Entrance at :6am Pick up desk phone and dial [phone] and inform us of your arrival.  Please call [phone] if you have any problems the morning of surgery.  Remember: Do not eat food or drink liquids, including water, after midnight:Monday   You may brush your teeth the morning of surgery.   DO NOT wear jewelry, eye make-up, lipstick,body lotion, or dark fingernail polish.  (Polished toes are ok) You may wear deodorant.  If you are to be admitted after surgery, leave suitcase in car until your room has been assigned. Patients discharged on the day of surgery will not be allowed to drive home. Wear loose fitting, comfortable clothes for your ride home.

## 2014-07-02 LAB — RPR: RPR Ser Ql: NONREACTIVE

## 2014-07-03 NOTE — H&P (Signed)
Tammie Atkinson is a 34 y.o. female G2P1001 at 6739 1/7 weeks  (EDD 07/12/14 by 7 week US)  for repeat c-section.  Prenatal care uncomplicated except for a h/o prior c-section and declines TOL.  She does not want a tubal ligation after careful consideration.  Maternal Medical History:  Contractions: Frequency: rare.   Perceived severity is mild.    Fetal activity: Perceived fetal activity is normal.    Prenatal Complications - Diabetes: none.    OB History    Gravida Para Term Preterm AB TAB SAB Ectopic Multiple Living   2 1 1  0 0 0 0 0 0 1    2013 LTCS 7#13 Fetal distress  Past Medical History  Diagnosis Date  . Heartburn during pregnancy   . Medical history non-contributory    Past Surgical History  Procedure Laterality Date  . Wisdom tooth extraction    . Cesarean section  06/08/2011    Procedure: CESAREAN SECTION;  Surgeon: Bing Plumehomas F Henley, MD;  Location: [facility];  Service: Gynecology;  Laterality: N/A;   Family History: family history includes Cancer in her maternal grandmother and paternal grandfather; Diabetes in her maternal grandmother. Social History:  reports that she has never smoked. She has never used smokeless tobacco. She reports that she does not drink alcohol or use illicit drugs.   Prenatal Transfer Tool  Maternal Diabetes: No Genetic Screening: Declined Maternal Ultrasounds/Referrals: Normal Fetal Ultrasounds or other Referrals:  None Maternal Substance Abuse:  No Significant Maternal Medications:  None Significant Maternal Lab Results:  None Other Comments:  None  Review of Systems  Constitutional: Negative for malaise/fatigue.  Neurological: Negative for headaches.      Last menstrual period 09/16/2013, currently breastfeeding. Maternal Exam:  Uterine Assessment: Contraction strength is mild.  Contraction frequency is rare.   Abdomen: Patient reports no abdominal tenderness. Surgical scars: low transverse.   Introitus: Normal vulva. Normal  vagina.    Physical Exam  Constitutional: She appears well-developed and well-nourished.  Cardiovascular: Normal rate and regular rhythm.   Respiratory: Effort normal and breath sounds normal.  GI: Soft.  Genitourinary: Vagina normal.  Neurological: She is alert.  Psychiatric: She has a normal mood and affect.    Prenatal labs: ABO, Rh: --/--/B POS, B POS (06/30 1215) Antibody: NEG (06/30 1215) Rubella: Immune (12/16 0000) RPR: Non Reactive (06/30 1215)  HBsAg: Negative (12/16 0000)  HIV: Non-reactive (12/16 0000)  GBS:   negative One hour GTT 97 Declined genetics   Assessment/Plan: Patient was counseled regarding the risks and benefits of C-section and the procedure was reviewed in detail. Risks of bleeding, infection, and possible damage to bowel and bladder were reviewed, The patient would accept a blood transfusion if needed. She desires to proceed.    Oliver PilaICHARDSON,Elihue Ebert W 07/03/2014, 10:45 AM

## 2014-07-04 ENCOUNTER — Encounter (HOSPITAL_COMMUNITY): Payer: Self-pay | Admitting: Anesthesiology

## 2014-07-04 NOTE — Anesthesia Preprocedure Evaluation (Addendum)
Anesthesia Evaluation  Patient identified by MRN, date of birth, ID band Patient awake    Reviewed: Allergy & Precautions, NPO status , Patient's Chart, lab work & pertinent test results  Airway Mallampati: II  TM Distance: >3 FB Neck ROM: Full    Dental no notable dental hx. (+) Teeth Intact   Pulmonary neg pulmonary ROS,  breath sounds clear to auscultation  Pulmonary exam normal       Cardiovascular negative cardio ROS Normal cardiovascular examRhythm:Regular Rate:Abnormal     Neuro/Psych negative neurological ROS  negative psych ROS   GI/Hepatic Neg liver ROS, GERD-  Medicated and Controlled,  Endo/Other  Obesity  Renal/GU negative Renal ROS  negative genitourinary   Musculoskeletal   Abdominal   Peds  Hematology negative hematology ROS (+)   Anesthesia Other Findings   Reproductive/Obstetrics (+) Pregnancy Previous C/Section                            Anesthesia Physical Anesthesia Plan  ASA: II  Anesthesia Plan: Spinal   Post-op Pain Management:    Induction:   Airway Management Planned: Natural Airway  Additional Equipment:   Intra-op Plan:   Post-operative Plan:   Informed Consent: I have reviewed the patients History and Physical, chart, labs and discussed the procedure including the risks, benefits and alternatives for the proposed anesthesia with the patient or authorized representative who has indicated his/her understanding and acceptance.     Plan Discussed with: CRNA, Anesthesiologist and Surgeon  Anesthesia Plan Comments:         Anesthesia Quick Evaluation

## 2014-07-05 MED ORDER — GENTAMICIN SULFATE 40 MG/ML IJ SOLN
INTRAVENOUS | Status: AC
  Administered 2014-07-06: 114 mL via INTRAVENOUS
  Filled 2014-07-05: qty 8

## 2014-07-06 ENCOUNTER — Encounter (HOSPITAL_COMMUNITY): Payer: Self-pay

## 2014-07-06 ENCOUNTER — Inpatient Hospital Stay (HOSPITAL_COMMUNITY)
Admission: RE | Admit: 2014-07-06 | Discharge: 2014-07-08 | DRG: 766 | Disposition: A | Discharge status: Home / Self Care | Payer: BC Managed Care – PPO | Source: Ambulatory Visit | Attending: Obstetrics and Gynecology | Admitting: Obstetrics and Gynecology

## 2014-07-06 ENCOUNTER — Encounter (HOSPITAL_COMMUNITY)
Admission: RE | Disposition: A | Discharge status: Home / Self Care | Payer: Self-pay | Source: Ambulatory Visit | Attending: Obstetrics and Gynecology

## 2014-07-06 ENCOUNTER — Inpatient Hospital Stay (HOSPITAL_COMMUNITY): Payer: BC Managed Care – PPO | Admitting: Anesthesiology

## 2014-07-06 ENCOUNTER — Inpatient Hospital Stay (HOSPITAL_COMMUNITY)
Admission: AD | Admit: 2014-07-06 | Payer: BC Managed Care – PPO | Source: Ambulatory Visit | Admitting: Obstetrics and Gynecology

## 2014-07-06 DIAGNOSIS — O3421 Maternal care for scar from previous cesarean delivery: Secondary | ICD-10-CM | POA: Diagnosis present

## 2014-07-06 DIAGNOSIS — Z3A39 39 weeks gestation of pregnancy: Secondary | ICD-10-CM | POA: Diagnosis present

## 2014-07-06 DIAGNOSIS — Z3483 Encounter for supervision of other normal pregnancy, third trimester: Secondary | ICD-10-CM | POA: Diagnosis present

## 2014-07-06 DIAGNOSIS — Z98891 History of uterine scar from previous surgery: Secondary | ICD-10-CM

## 2014-07-06 LAB — CBC
HCT: 35.5 % — ABNORMAL LOW (ref 36.0–46.0)
Hemoglobin: 11.6 g/dL — ABNORMAL LOW (ref 12.0–15.0)
MCH: 28.4 pg (ref 26.0–34.0)
MCHC: 32.7 g/dL (ref 30.0–36.0)
MCV: 87 fL (ref 78.0–100.0)
PLATELETS: 162 10*3/uL (ref 150–400)
RBC: 4.08 MIL/uL (ref 3.87–5.11)
RDW: 15.4 % (ref 11.5–15.5)
WBC: 14 10*3/uL — ABNORMAL HIGH (ref 4.0–10.5)

## 2014-07-06 LAB — TYPE AND SCREEN
ABO/RH(D): B POS
ANTIBODY SCREEN: NEGATIVE

## 2014-07-06 SURGERY — Surgical Case
Anesthesia: Spinal | Site: Abdomen

## 2014-07-06 MED ORDER — LACTATED RINGERS IV BOLUS (SEPSIS)
500.0000 mL | Freq: Once | INTRAVENOUS | Status: AC
  Administered 2014-07-06: 500 mL via INTRAVENOUS

## 2014-07-06 MED ORDER — SCOPOLAMINE 1 MG/3DAYS TD PT72
1.0000 | MEDICATED_PATCH | Freq: Once | TRANSDERMAL | Status: DC
  Administered 2014-07-06: 1.5 mg via TRANSDERMAL

## 2014-07-06 MED ORDER — LACTATED RINGERS IV SOLN
INTRAVENOUS | Status: DC
  Administered 2014-07-06: 08:00:00 via INTRAVENOUS

## 2014-07-06 MED ORDER — ACETAMINOPHEN 325 MG PO TABS
650.0000 mg | ORAL_TABLET | ORAL | Status: DC | PRN
  Administered 2014-07-06: 650 mg via ORAL
  Filled 2014-07-06: qty 2

## 2014-07-06 MED ORDER — IBUPROFEN 600 MG PO TABS
600.0000 mg | ORAL_TABLET | Freq: Four times a day (QID) | ORAL | Status: DC
  Administered 2014-07-06 – 2014-07-08 (×4): 600 mg via ORAL
  Filled 2014-07-06 (×8): qty 1

## 2014-07-06 MED ORDER — CHLOROPROCAINE HCL 3 % IJ SOLN: INTRAMUSCULAR | Status: DC | PRN

## 2014-07-06 MED ORDER — PHENYLEPHRINE 8 MG IN D5W 100 ML (0.08MG/ML) PREMIX OPTIME
INJECTION | INTRAVENOUS | Status: DC | PRN
  Administered 2014-07-06: 60 ug/min via INTRAVENOUS

## 2014-07-06 MED ORDER — FENTANYL CITRATE (PF) 100 MCG/2ML IJ SOLN
INTRAMUSCULAR | Status: DC | PRN
  Administered 2014-07-06: 10 ug via INTRATHECAL

## 2014-07-06 MED ORDER — KETOROLAC TROMETHAMINE 30 MG/ML IJ SOLN
INTRAMUSCULAR | Status: AC
  Filled 2014-07-06: qty 1

## 2014-07-06 MED ORDER — NALBUPHINE HCL 10 MG/ML IJ SOLN: 5.0000 mg | INTRAMUSCULAR | Status: DC | PRN

## 2014-07-06 MED ORDER — ONDANSETRON HCL 4 MG/2ML IJ SOLN: 4.0000 mg | Freq: Three times a day (TID) | INTRAMUSCULAR | Status: DC | PRN

## 2014-07-06 MED ORDER — SIMETHICONE 80 MG PO CHEW
80.0000 mg | CHEWABLE_TABLET | Freq: Three times a day (TID) | ORAL | Status: DC
  Administered 2014-07-06 – 2014-07-08 (×4): 80 mg via ORAL
  Filled 2014-07-06 (×4): qty 1

## 2014-07-06 MED ORDER — NALOXONE HCL 0.4 MG/ML IJ SOLN: 0.4000 mg | INTRAMUSCULAR | Status: DC | PRN

## 2014-07-06 MED ORDER — MORPHINE SULFATE 0.5 MG/ML IJ SOLN
INTRAMUSCULAR | Status: AC
  Filled 2014-07-06: qty 10

## 2014-07-06 MED ORDER — SODIUM CHLORIDE 0.9 % IJ SOLN: 3.0000 mL | INTRAMUSCULAR | Status: DC | PRN

## 2014-07-06 MED ORDER — ZOLPIDEM TARTRATE 5 MG PO TABS: 5.0000 mg | ORAL_TABLET | Freq: Every evening | ORAL | Status: DC | PRN

## 2014-07-06 MED ORDER — OXYCODONE-ACETAMINOPHEN 5-325 MG PO TABS
2.0000 | ORAL_TABLET | ORAL | Status: DC | PRN
Start: 2014-07-06 — End: 2014-07-08

## 2014-07-06 MED ORDER — SIMETHICONE 80 MG PO CHEW
80.0000 mg | CHEWABLE_TABLET | ORAL | Status: DC | PRN
  Administered 2014-07-06: 80 mg via ORAL

## 2014-07-06 MED ORDER — TETANUS-DIPHTH-ACELL PERTUSSIS 5-2.5-18.5 LF-MCG/0.5 IM SUSP: 0.5000 mL | Freq: Once | INTRAMUSCULAR | Status: DC

## 2014-07-06 MED ORDER — KETOROLAC TROMETHAMINE 30 MG/ML IJ SOLN: 30.0000 mg | Freq: Four times a day (QID) | INTRAMUSCULAR | Status: AC | PRN

## 2014-07-06 MED ORDER — BUPIVACAINE IN DEXTROSE 0.75-8.25 % IT SOLN
INTRATHECAL | Status: DC | PRN
  Administered 2014-07-06: 1.4 mL via INTRATHECAL

## 2014-07-06 MED ORDER — MORPHINE SULFATE (PF) 0.5 MG/ML IJ SOLN
INTRAMUSCULAR | Status: DC | PRN
  Administered 2014-07-06: .2 mg via INTRATHECAL

## 2014-07-06 MED ORDER — DIBUCAINE 1 % RE OINT: 1.0000 "application " | TOPICAL_OINTMENT | RECTAL | Status: DC | PRN

## 2014-07-06 MED ORDER — DIPHENHYDRAMINE HCL 25 MG PO CAPS
25.0000 mg | ORAL_CAPSULE | ORAL | Status: DC | PRN
  Filled 2014-07-06: qty 1

## 2014-07-06 MED ORDER — OXYTOCIN 10 UNIT/ML IJ SOLN
40.0000 [IU] | INTRAVENOUS | Status: DC | PRN
  Administered 2014-07-06: 40 [IU] via INTRAVENOUS

## 2014-07-06 MED ORDER — LACTATED RINGERS IV SOLN
INTRAVENOUS | Status: DC
Start: 2014-07-06 — End: 2014-07-06
  Administered 2014-07-06 (×2): via INTRAVENOUS

## 2014-07-06 MED ORDER — WITCH HAZEL-GLYCERIN EX PADS: 1.0000 "application " | MEDICATED_PAD | CUTANEOUS | Status: DC | PRN

## 2014-07-06 MED ORDER — PRENATAL MULTIVITAMIN CH
1.0000 | ORAL_TABLET | Freq: Every day | ORAL | Status: DC
  Administered 2014-07-07 – 2014-07-08 (×2): 1 via ORAL
  Filled 2014-07-06 (×2): qty 1

## 2014-07-06 MED ORDER — LANOLIN HYDROUS EX OINT: 1.0000 "application " | TOPICAL_OINTMENT | CUTANEOUS | Status: DC | PRN

## 2014-07-06 MED ORDER — SCOPOLAMINE 1 MG/3DAYS TD PT72
MEDICATED_PATCH | TRANSDERMAL | Status: AC
  Administered 2014-07-06: 1.5 mg via TRANSDERMAL
  Filled 2014-07-06: qty 1

## 2014-07-06 MED ORDER — DIPHENHYDRAMINE HCL 50 MG/ML IJ SOLN: 12.5000 mg | INTRAMUSCULAR | Status: DC | PRN

## 2014-07-06 MED ORDER — FENTANYL CITRATE (PF) 100 MCG/2ML IJ SOLN: 25.0000 ug | INTRAMUSCULAR | Status: DC | PRN

## 2014-07-06 MED ORDER — SENNOSIDES-DOCUSATE SODIUM 8.6-50 MG PO TABS
2.0000 | ORAL_TABLET | ORAL | Status: DC
  Administered 2014-07-06 – 2014-07-07 (×2): 2 via ORAL
  Filled 2014-07-06 (×2): qty 2

## 2014-07-06 MED ORDER — OXYTOCIN 10 UNIT/ML IJ SOLN
INTRAMUSCULAR | Status: AC
  Filled 2014-07-06: qty 4

## 2014-07-06 MED ORDER — PHENYLEPHRINE 8 MG IN D5W 100 ML (0.08MG/ML) PREMIX OPTIME
INJECTION | INTRAVENOUS | Status: AC
  Filled 2014-07-06: qty 100

## 2014-07-06 MED ORDER — MEPERIDINE HCL 25 MG/ML IJ SOLN: 6.2500 mg | INTRAMUSCULAR | Status: DC | PRN

## 2014-07-06 MED ORDER — MENTHOL 3 MG MT LOZG: 1.0000 | LOZENGE | OROMUCOSAL | Status: DC | PRN

## 2014-07-06 MED ORDER — DEXTROSE 5 % IV SOLN: 1.0000 ug/kg/h | INTRAVENOUS | Status: DC | PRN

## 2014-07-06 MED ORDER — LACTATED RINGERS IV SOLN: INTRAVENOUS | Status: DC

## 2014-07-06 MED ORDER — DIPHENHYDRAMINE HCL 25 MG PO CAPS: 25.0000 mg | ORAL_CAPSULE | Freq: Four times a day (QID) | ORAL | Status: DC | PRN

## 2014-07-06 MED ORDER — NALBUPHINE HCL 10 MG/ML IJ SOLN: 5.0000 mg | Freq: Once | INTRAMUSCULAR | Status: AC | PRN

## 2014-07-06 MED ORDER — ONDANSETRON HCL 4 MG/2ML IJ SOLN
INTRAMUSCULAR | Status: AC
  Filled 2014-07-06: qty 2

## 2014-07-06 MED ORDER — ONDANSETRON HCL 4 MG/2ML IJ SOLN
INTRAMUSCULAR | Status: DC | PRN
  Administered 2014-07-06: 4 mg via INTRAVENOUS

## 2014-07-06 MED ORDER — FENTANYL CITRATE (PF) 100 MCG/2ML IJ SOLN
INTRAMUSCULAR | Status: AC
  Filled 2014-07-06: qty 2

## 2014-07-06 MED ORDER — IBUPROFEN 600 MG PO TABS
600.0000 mg | ORAL_TABLET | Freq: Four times a day (QID) | ORAL | Status: DC | PRN
  Administered 2014-07-06 – 2014-07-08 (×4): 600 mg via ORAL

## 2014-07-06 MED ORDER — OXYCODONE-ACETAMINOPHEN 5-325 MG PO TABS
1.0000 | ORAL_TABLET | ORAL | Status: DC | PRN
  Administered 2014-07-07 – 2014-07-08 (×3): 1 via ORAL
  Filled 2014-07-06 (×3): qty 1

## 2014-07-06 MED ORDER — SIMETHICONE 80 MG PO CHEW
80.0000 mg | CHEWABLE_TABLET | ORAL | Status: DC
  Administered 2014-07-06 – 2014-07-07 (×2): 80 mg via ORAL
  Filled 2014-07-06 (×2): qty 1

## 2014-07-06 MED ORDER — OXYTOCIN 40 UNITS IN LACTATED RINGERS INFUSION - SIMPLE MED
62.5000 mL/h | INTRAVENOUS | Status: AC
Start: 2014-07-06 — End: 2014-07-07

## 2014-07-06 SURGICAL SUPPLY — 31 items
APL SKNCLS STERI-STRIP NONHPOA (GAUZE/BANDAGES/DRESSINGS) ×1
BENZOIN TINCTURE PRP APPL 2/3 (GAUZE/BANDAGES/DRESSINGS) ×2 IMPLANT
CLAMP CORD UMBIL (MISCELLANEOUS) IMPLANT
CLOTH BEACON ORANGE TIMEOUT ST (SAFETY) ×2 IMPLANT
DRAPE SHEET LG 3/4 BI-LAMINATE (DRAPES) IMPLANT
DRSG OPSITE POSTOP 4X10 (GAUZE/BANDAGES/DRESSINGS) ×2 IMPLANT
DURAPREP 26ML APPLICATOR (WOUND CARE) ×2 IMPLANT
ELECT REM PT RETURN 9FT ADLT (ELECTROSURGICAL) ×2
ELECTRODE REM PT RTRN 9FT ADLT (ELECTROSURGICAL) ×1 IMPLANT
EXTRACTOR VACUUM KIWI (MISCELLANEOUS) IMPLANT
GLOVE BIO SURGEON STRL SZ 6.5 (GLOVE) ×2 IMPLANT
GOWN STRL REUS W/TWL LRG LVL3 (GOWN DISPOSABLE) ×4 IMPLANT
KIT ABG SYR 3ML LUER SLIP (SYRINGE) IMPLANT
NEEDLE HYPO 25X5/8 SAFETYGLIDE (NEEDLE) IMPLANT
NS IRRIG 1000ML POUR BTL (IV SOLUTION) ×2 IMPLANT
PACK C SECTION WH (CUSTOM PROCEDURE TRAY) ×2 IMPLANT
PAD OB MATERNITY 4.3X12.25 (PERSONAL CARE ITEMS) ×2 IMPLANT
RTRCTR C-SECT PINK 25CM LRG (MISCELLANEOUS) ×2 IMPLANT
STRIP CLOSURE SKIN 1/2X4 (GAUZE/BANDAGES/DRESSINGS) ×2 IMPLANT
SUT CHROMIC 1 CTX 36 (SUTURE) ×4 IMPLANT
SUT PLAIN 0 NONE (SUTURE) IMPLANT
SUT PLAIN 2 0 XLH (SUTURE) ×2 IMPLANT
SUT VIC AB 0 CT1 27 (SUTURE) ×4
SUT VIC AB 0 CT1 27XBRD ANBCTR (SUTURE) ×2 IMPLANT
SUT VIC AB 2-0 CT1 27 (SUTURE) ×1
SUT VIC AB 2-0 CT1 TAPERPNT 27 (SUTURE) ×1 IMPLANT
SUT VIC AB 3-0 CT1 27 (SUTURE)
SUT VIC AB 3-0 CT1 TAPERPNT 27 (SUTURE) IMPLANT
SUT VIC AB 4-0 KS 27 (SUTURE) ×2 IMPLANT
TOWEL OR 17X24 6PK STRL BLUE (TOWEL DISPOSABLE) ×2 IMPLANT
TRAY FOLEY CATH SILVER 14FR (SET/KITS/TRAYS/PACK) ×2 IMPLANT

## 2014-07-06 NOTE — Addendum Note (Signed)
Addendum  created 07/06/14 1258 by Yolonda KidaAlison L Amaranta Mehl, CRNA   Modules edited: Notes Section   Notes Section:  File: [id number]

## 2014-07-06 NOTE — Lactation Note (Signed)
This note was copied from the chart of Tammie Raymond Gurneyshley Isenberg. Lactation Consultation Note  Patient Name: Tammie Atkinson ZOXWR'UToday's Date: 07/06/2014 Reason for consult: Initial assessment Baby 10 hours old. Parents state baby has been sleepy, but mom is offering lots of STS and attempts at breast. 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 Enc mom to keep offering lots of STS and to call for assistance with latching as needed. [facility], aware of OP/BFSG, community resources, and [facility]  Maternal Data Has patient been taught Hand Expression?: Yes Does the patient have breastfeeding experience prior to this delivery?: Yes  Feeding    LATCH Score/Interventions                      Lactation Tools Discussed/Used     Consult Status Consult Status: PRN    Geralynn OchsWILLIARD, Dravyn Severs 07/06/2014, 6:17 PM

## 2014-07-06 NOTE — Op Note (Signed)
Operative Note    Preoperative Diagnosis Term Pregnancy at 39 1/7 weeks Prior c-section, declines TOL  Postoperative Diagnosis same   Procedure Repeat low transverse c-section with 2 layer closure of uterus  Surgeon Huel CoteKathy Milan Clare, MD  Anesthesia Spinal  Fluids: EBL  500cc UOP  150cc clear IVF 2200 ccLR  Findings There was a viable female infant in the vertex presentation.  Apgars 8,9. Weight 8#4oz.  Uterus, tubes and ovaries WNL  Specimen Placenta to L&D  Procedure Note Procedure note  Patient was taken to the operating room where spinal anesthesia was obtained without difficulty and was found to be adequate by Allis clamp test. She was prepped and draped in the normal sterile fashion in the dorsal supine position with a leftward tilt. An appropriate time out was performed. A Pfannenstiel skin incision was then made through a preexisting scar with the scalpel and carried through to the underlying layer of fascia by sharp dissection and Bovie cautery. The fascia was nicked in the midline and the incision was extended laterally with Mayo scissors. The inferior aspect of the incision was grasped Coker clamps and dissected off the underlying rectus muscles. In a similar fashion the superior aspect was dissected off the rectus muscles. Rectus muscles were separated in the midline and the peritoneal cavity entered bluntly. The peritoneal incision was then extended both superiorly and inferiorly with careful attention to avoid both bowel and bladder. The Alexis self-retaining wound retractor was then placed within the incision and the lower uterine segment exposed. The bladder flap was developed with Metzenbaum scissors and pushed away from the lower uterine segment. The lower uterine segment was then incised in a transverse fashion and the cavity itself entered bluntly. The incision was extended bluntly. The infant's head was then lifted and delivered from the incision without  difficulty. The remainder of the infant delivered and the nose and mouth bulb suctioned with the cord clamped and cut as well. The infant was handed off to the waiting pediatricians. The placenta was then spontaneously expressed from the uterus and the uterus cleared of all clots and debris with moist lap sponge. The uterine incision was then repaired in 2 layers the first layer was a running locked layer 1-0 chromic and the second an imbricating layer of the same suture. The tubes and ovaries were inspected and the gutters cleared of all clots and debris. The uterine incision was inspected and found to be hemostatic. All instruments and sponges as well as the Alexis retractor were then removed from the abdomen. The rectus muscles and peritoneum were then reapproximated with a running suture of 2-0 Vicryl. The fascia was then closed with 0 Vicryl in a running fashion. Subcutaneous tissue was reapproximated with 3-0 plain in a running fashion. The skin was closed with a subcuticular stitch of 4-0 Vicryl on a Keith needle and then reinforced with benzoin and Steri-Strips. At the conclusion of the procedure all instruments and sponge counts were correct. Patient was taken to the recovery room in good condition with her baby accompanying her skin to skin.

## 2014-07-06 NOTE — Progress Notes (Signed)
Patient ID: Tammie Atkinson, female   DOB: 1980-09-21, 34 y.o.   MRN: [id number] Per pt no changes in dictated H&P. Brief exam WNL.  Ready to proceed

## 2014-07-06 NOTE — Progress Notes (Signed)
Dr. Jackelyn KnifeMeisinger notified of patient's blood pressure of 90/48 lying and 89/46 sitting, Temp of 99.2 and urine output of 1200 along with syncope upon sitting up in bed. See new orders. Patient states she feels bette when lying down.

## 2014-07-06 NOTE — Transfer of Care (Signed)
Immediate Anesthesia Transfer of Care Note  Patient: Tammie Atkinson  Procedure(s) Performed: Procedure(s): CESAREAN SECTION (N/A)  Patient Location: PACU  Anesthesia Type:Spinal  Level of Consciousness: awake, alert  and oriented  Airway & Oxygen Therapy: Patient Spontanous Breathing  Post-op Assessment: Report given to RN  Post vital signs: Reviewed  Last Vitals:  Filed Vitals:   07/06/14 0628  BP: 112/73  Pulse: 105  Temp: 36.6 C  Resp: 18    Complications: No apparent anesthesia complications

## 2014-07-06 NOTE — Progress Notes (Signed)
Dr. Jackelyn KnifeMeisinger was a left a message of CBC results from 1529 this afternoon. Waiting for response.

## 2014-07-06 NOTE — Anesthesia Procedure Notes (Signed)
Spinal Patient location during procedure: OR Start time: 07/06/2014 7:40 AM Staffing Anesthesiologist: Stanislaus Kaltenbach Performed by: anesthesiologist  Preanesthetic Checklist Completed: patient identified, site marked, surgical consent, pre-op evaluation, timeout performed, IV checked, risks and benefits discussed and monitors and equipment checked Spinal Block Patient position: sitting Prep: site prepped and draped and DuraPrep Patient monitoring: heart rate, continuous pulse ox and blood pressure Location: L4-5 Injection technique: single-shot Needle Needle type: Spinocan  Needle gauge: 24 G Needle length: 9 cm Assessment Sensory level: T6 Additional Notes Expiration date of kit checked and confirmed. Patient tolerated procedure well, without complications.

## 2014-07-06 NOTE — Anesthesia Postprocedure Evaluation (Signed)
  Anesthesia Post-op Note  Patient: Tammie Atkinson  Procedure(s) Performed: Procedure(s): CESAREAN SECTION (N/A)  Patient Location: Mother/Baby  Anesthesia Type:Spinal  Level of Consciousness: awake, alert , oriented and patient cooperative  Airway and Oxygen Therapy: Patient Spontanous Breathing  Post-op Pain: none  Post-op Assessment: Post-op Vital signs reviewed, Patient's Cardiovascular Status Stable, Respiratory Function Stable, Patent Airway, No headache, No backache and Patient able to bend at knees              Post-op Vital Signs: Reviewed and stable  Last Vitals:  Filed Vitals:   07/06/14 1230  BP: 104/54  Pulse: 80  Temp: 36.8 C  Resp: 18    Complications: No apparent anesthesia complications

## 2014-07-06 NOTE — Anesthesia Postprocedure Evaluation (Signed)
  Anesthesia Post-op Note  Patient: Tammie Atkinson  Procedure(s) Performed: Procedure(s): CESAREAN SECTION (N/A)  Patient Location: PACU  Anesthesia Type:Spinal  Level of Consciousness: awake, alert  and oriented  Airway and Oxygen Therapy: Patient Spontanous Breathing  Post-op Pain: none  Post-op Assessment: Post-op Vital signs reviewed, Patient's Cardiovascular Status Stable, Respiratory Function Stable, Patent Airway, No signs of Nausea or vomiting, Pain level controlled, No headache, No backache, Spinal receding and Patient able to bend at knees              Post-op Vital Signs: Reviewed and stable  Last Vitals:  Filed Vitals:   07/06/14 0930  BP: 91/68  Pulse: 94  Temp:   Resp: 26    Complications: No apparent anesthesia complications

## 2014-07-07 ENCOUNTER — Encounter (HOSPITAL_COMMUNITY): Payer: Self-pay | Admitting: Obstetrics and Gynecology

## 2014-07-07 LAB — CCBB MATERNAL DONOR DRAW

## 2014-07-07 LAB — CBC
HCT: 33.9 % — ABNORMAL LOW (ref 36.0–46.0)
Hemoglobin: 11.1 g/dL — ABNORMAL LOW (ref 12.0–15.0)
MCH: 28.6 pg (ref 26.0–34.0)
MCHC: 32.7 g/dL (ref 30.0–36.0)
MCV: 87.4 fL (ref 78.0–100.0)
PLATELETS: 158 10*3/uL (ref 150–400)
RBC: 3.88 MIL/uL (ref 3.87–5.11)
RDW: 15.8 % — ABNORMAL HIGH (ref 11.5–15.5)
WBC: 13.8 10*3/uL — ABNORMAL HIGH (ref 4.0–10.5)

## 2014-07-07 LAB — BIRTH TISSUE RECOVERY COLLECTION (PLACENTA DONATION)

## 2014-07-07 LAB — RPR: RPR Ser Ql: NONREACTIVE

## 2014-07-07 NOTE — Discharge Summary (Signed)
Obstetric Discharge Summary Reason for Admission: cesarean section Prenatal Procedures: none Intrapartum Procedures: cesarean: low cervical, transverse Postpartum Procedures: none Complications-Operative and Postpartum: none HEMOGLOBIN  Date Value Ref Range Status  07/07/2014 11.1* 12.0 - 15.0 g/dL Final   HCT  Date Value Ref Range Status  07/07/2014 33.9* 36.0 - 46.0 % Final    Physical Exam:  General: alert and cooperative Lochia: appropriate Uterine Fundus: firm Incision: C/D/I   Discharge Diagnoses: Term Pregnancy-delivered  Discharge Information: Date: 07/07/2014 Activity: pelvic rest Diet: routine Medications: Ibuprofen and Percocet Condition: stable Instructions: refer to practice specific booklet Discharge to: home Follow-up Information    Follow up with Oliver PilaICHARDSON,Londan Coplen W, MD. Schedule an appointment as soon as possible for a visit in 2 weeks.   Specialty:  Obstetrics and Gynecology   Why:  incision check   Contact information:   [address] [place] [address] [phone]       Newborn Data: Live born female  Birth Weight: 8 lb 3.6 oz (3731 g) APGAR: 9, 9  Home with mother.  Oliver PilaRICHARDSON,Taje Littler W 07/07/2014, 8:17 PM

## 2014-07-07 NOTE — Progress Notes (Signed)
Subjective: Postpartum Day 1: Cesarean Delivery Patient reports tolerating PO and no problems voiding.  Dizziness from yesterday resolved  Objective: Vital signs in last 24 hours: Temp:  [98.1 F (36.7 C)-99.5 F (37.5 C)] 98.1 F (36.7 C) (07/06 0630) Pulse Rate:  [65-98] 81 (07/06 0630) Resp:  [16-18] 18 (07/06 0630) BP: (90-104)/(48-66) 97/57 mmHg (07/06 0630) SpO2:  [95 %-96 %] 96 % (07/06 0200) Weight:  [79.379 kg (175 lb)] 79.379 kg (175 lb) (07/06 0500)  Physical Exam:  General: alert and cooperative Lochia: appropriate Uterine Fundus: firm Incision: C/D/I  Recent Labs  07/06/14 1529 07/07/14 0512  HGB 11.6* 11.1*  HCT 35.5* 33.9*    Assessment/Plan: Status post Cesarean section. Doing well postoperatively.  Continue current care.  Oliver PilaICHARDSON,Antonietta Lansdowne W 07/07/2014, 10:24 AM

## 2014-07-08 MED ORDER — OXYCODONE-ACETAMINOPHEN 5-325 MG PO TABS: 1.0000 | ORAL_TABLET | ORAL | Status: DC | PRN

## 2014-07-08 MED ORDER — IBUPROFEN 600 MG PO TABS: 600.0000 mg | ORAL_TABLET | Freq: Four times a day (QID) | ORAL | Status: AC | PRN

## 2014-07-08 NOTE — Lactation Note (Signed)
This note was copied from the chart of Boy Raymond Gurneyshley Masaki. Lactation Consultation Note  Patient Name: Boy Raymond Gurneyshley Seelye ZOXWR'UToday's Date: 07/08/2014 Reason for consult: Follow-up assessment  Mom reports baby is nursing well, cluster feeding. She has pumped and received approx 45 ml of colostrum and would like LC to help with SNS or curved tipped syringe to give this back to the baby. Baby asleep at this visit, recently fed and Mom having breakfast. LC advised Mom to call with next feeding for assist. Engorgement care reviewed if needed.   Maternal Data    Feeding Feeding Type: Breast Fed Length of feed: 10 min  LATCH Score/Interventions Latch: Grasps breast easily, tongue down, lips flanged, rhythmical sucking.  Audible Swallowing: Spontaneous and intermittent  Type of Nipple: Everted at rest and after stimulation  Comfort (Breast/Nipple): Soft / non-tender     Hold (Positioning): No assistance needed to correctly position infant at breast.  LATCH Score: 10  Lactation Tools Discussed/Used Tools: Pump Breast pump type: Double-Electric Breast Pump   Consult Status Consult Status: Follow-up Date: 07/08/14 Follow-up type: In-patient    Alfred LevinsGranger, Johnnetta Holstine Ann 07/08/2014, 8:41 AM

## 2014-07-08 NOTE — Progress Notes (Signed)
Subjective: Postpartum Day 2 Cesarean Delivery Patient reports tolerating PO and no problems voiding.    Objective: Vital signs in last 24 hours: Temp:  [98 F (36.7 C)-98.2 F (36.8 C)] 98.2 F (36.8 C) (07/07 0617) Pulse Rate:  [84-86] 84 (07/07 0617) Resp:  [16-18] 16 (07/07 0617) BP: (99-102)/(61-62) 99/61 mmHg (07/07 0617)  Physical Exam:  General: alert and cooperative Lochia: appropriate Uterine Fundus: firm Incision: C/D/I    Recent Labs  07/06/14 1529 07/07/14 0512  HGB 11.6* 11.1*  HCT 35.5* 33.9*    Assessment/Plan: Status post Cesarean section. Doing well postoperatively.  D/c home to f/u in 2 weeks for incision check Waqas Bruhl W 07/08/2014, 8:50 AM

## 2014-07-08 NOTE — Lactation Note (Signed)
This note was copied from the chart of Tammie Atkinson. Lactation Consultation Note  Patient Name: Tammie Raymond Gurneyshley Donna ZHYQM'VToday's Date: 07/08/2014 Reason for consult: Follow-up assessment Mom had just latched baby. Some intermittent clicking noted.  LC assisted Mom with positioning on the left breast to obtain more depth. Demonstrated breast massage to help baby sustain a good suckling pattern. Baby demonstrated good suckling bursts with audible swallows. Attempted SNS on right breast for Mom to give baby back some EBM she pumped this am. Baby sleepy and would not latch. Reviewed set up/cleaning and latching baby using SNS. Also demonstrated how to give supplement using curved tipped syringe. Baby would suckle on LC finger. Mom able to return demonstration. Mom plans to keep baby at the breast. Advised baby should be BF 8-12 times in 24 hours and with feeding ques. Try to keep baby nursing for 15-20 minutes both breasts when possible. Engorgement care reviewed if needed. Advised of OP services and support group. Care for sore nipples reviewed, comfort gels given with instructions.   Maternal Data    Feeding Feeding Type: Breast Milk Length of feed: 20 min  LATCH Score/Interventions Latch: Grasps breast easily, tongue down, lips flanged, rhythmical sucking.  Audible Swallowing: Spontaneous and intermittent  Type of Nipple: Everted at rest and after stimulation (short nipple shaft)  Comfort (Breast/Nipple): Filling, red/small blisters or bruises, mild/mod discomfort  Problem noted:  (left nipple intact, right nipple has some bruising/cracking )  Hold (Positioning): Assistance needed to correctly position infant at breast and maintain latch. Intervention(s): Breastfeeding basics reviewed;Support Pillows;Position options;Skin to skin  LATCH Score: 8  Lactation Tools Discussed/Used Tools: Pump Breast pump type: Double-Electric Breast Pump   Consult Status Consult Status:  Follow-up Date: 07/08/14 Follow-up type: In-patient    Tammie Atkinson, Tammie Atkinson 07/08/2014, 10:41 AM

## 2015-11-15 ENCOUNTER — Emergency Department (HOSPITAL_COMMUNITY): Payer: BC Managed Care – PPO

## 2015-11-15 ENCOUNTER — Encounter (HOSPITAL_COMMUNITY): Payer: Self-pay

## 2015-11-15 ENCOUNTER — Emergency Department (HOSPITAL_COMMUNITY)
Admission: EM | Admit: 2015-11-15 | Discharge: 2015-11-15 | Disposition: A | Discharge status: Home / Self Care | Payer: BC Managed Care – PPO | Attending: Emergency Medicine | Admitting: Emergency Medicine

## 2015-11-15 DIAGNOSIS — H00032 Abscess of right lower eyelid: Secondary | ICD-10-CM | POA: Diagnosis not present

## 2015-11-15 DIAGNOSIS — R220 Localized swelling, mass and lump, head: Secondary | ICD-10-CM | POA: Diagnosis present

## 2015-11-15 DIAGNOSIS — L03213 Periorbital cellulitis: Secondary | ICD-10-CM

## 2015-11-15 LAB — CBC WITH DIFFERENTIAL/PLATELET
Basophils Absolute: 0.1 10*3/uL (ref 0.0–0.1)
Basophils Relative: 1 %
EOS ABS: 0.2 10*3/uL (ref 0.0–0.7)
EOS PCT: 3 %
HCT: 44.7 % (ref 36.0–46.0)
Hemoglobin: 14.8 g/dL (ref 12.0–15.0)
LYMPHS ABS: 1.8 10*3/uL (ref 0.7–4.0)
Lymphocytes Relative: 22 %
MCH: 29 pg (ref 26.0–34.0)
MCHC: 33.1 g/dL (ref 30.0–36.0)
MCV: 87.5 fL (ref 78.0–100.0)
Monocytes Absolute: 0.3 10*3/uL (ref 0.1–1.0)
Monocytes Relative: 4 %
Neutro Abs: 5.6 10*3/uL (ref 1.7–7.7)
Neutrophils Relative %: 70 %
PLATELETS: 242 10*3/uL (ref 150–400)
RBC: 5.11 MIL/uL (ref 3.87–5.11)
RDW: 12.4 % (ref 11.5–15.5)
WBC: 8.1 10*3/uL (ref 4.0–10.5)

## 2015-11-15 LAB — BASIC METABOLIC PANEL
Anion gap: 9 (ref 5–15)
BUN: 10 mg/dL (ref 6–20)
CALCIUM: 9.8 mg/dL (ref 8.9–10.3)
CO2: 26 mmol/L (ref 22–32)
CREATININE: 0.71 mg/dL (ref 0.44–1.00)
Chloride: 103 mmol/L (ref 101–111)
GFR calc Af Amer: >60 mL/min (ref >60)
Glucose, Bld: 89 mg/dL (ref 65–99)
POTASSIUM: 3.8 mmol/L (ref 3.5–5.1)
SODIUM: 138 mmol/L (ref 135–145)

## 2015-11-15 LAB — I-STAT CG4 LACTIC ACID, ED: LACTIC ACID, VENOUS: 1.16 mmol/L (ref 0.5–1.9)

## 2015-11-15 MED ORDER — IOPAMIDOL (ISOVUE-300) INJECTION 61%
INTRAVENOUS | Status: AC
  Administered 2015-11-15: 75 mL via INTRAVENOUS
  Filled 2015-11-15: qty 75

## 2015-11-15 NOTE — ED Triage Notes (Signed)
Patient here with right sided facial redness and swelling. Seen at urgent care yesterday and given steroid and started on doxycycline. States today the redness has moved across bridge of nose and closer to eye, denies pain

## 2015-11-15 NOTE — Discharge Instructions (Signed)
Continue taking doxycycline as directed for the full 10 days. Benadryl as needed for swelling and/or itching. Take a photo of the affected area daily to monitor changes. Return to ER for fevers, visual changes, new or worsening symptoms, any additional concerns.

## 2015-11-15 NOTE — ED Provider Notes (Signed)
[facility]-EMERGENCY DEPT Provider Note   CSN: [id number] Arrival date & time: 11/15/15  4098     History   Chief Complaint Chief Complaint  Patient presents with  . cellulitis to face    HPI Tammie Atkinson is a 35 y.o. female.  The history is provided by the patient and medical records. No language interpreter was used.   Tammie Atkinson is an otherwise healthy 35 y.o. female  who presents to the Emergency Department complaining of worsening erythema and swelling to the right side of her face x 4 days. Patient states the area started as a small bump she thought was an insect bite, but has gradually gotten more erythematous and larger in size. She was seen by the urgent care yesterday for same where she was started on doxycycline and given a steroid shot. She has taken two doses of antibiotics, one last night and one this morning. Despite medication compliance, she awoke this morning with increased swelling and redness spreading much closer to the eye. She denies changes in vision, redness of the eye, eye pain, facial pain, fever, chills or other associated symptoms.    Past Medical History:  Diagnosis Date  . Heartburn during pregnancy   . Medical history non-contributory     Patient Active Problem List   Diagnosis Date Noted  . S/P repeat low transverse C-section 07/06/2014    Past Surgical History:  Procedure Laterality Date  . CESAREAN SECTION  06/08/2011   Procedure: CESAREAN SECTION;  Surgeon: Bing Plume, MD;  Location: [facility];  Service: Gynecology;  Laterality: N/A;  . CESAREAN SECTION N/A 07/06/2014   Procedure: CESAREAN SECTION;  Surgeon: Huel Cote, MD;  Location: [facility];  Service: Obstetrics;  Laterality: N/A;  . WISDOM TOOTH EXTRACTION      OB History    Gravida Para Term Preterm AB Living   2 2 2  0 0 2   SAB TAB Ectopic Multiple Live Births   0 0 0 0 2       Home Medications    Prior to Admission medications   Medication Sig Start Date End Date  Taking? Authorizing Provider  doxycycline (VIBRA-TABS) 100 MG tablet Take 100 mg by mouth 2 (two) times daily. 11/14/15  Yes Historical Provider, MD  ibuprofen (ADVIL,MOTRIN) 600 MG tablet Take 1 tablet (600 mg total) by mouth every 6 (six) hours as needed for mild pain. 07/08/14  Yes Huel Cote, MD  Multiple Vitamin (MULTIVITAMIN) tablet Take 1 tablet by mouth daily.   Yes Historical Provider, MD  oxyCODONE-acetaminophen (PERCOCET/ROXICET) 5-325 MG per tablet Take 1 tablet by mouth every 4 (four) hours as needed (for pain scale 4-7). Patient not taking: Reported on 11/15/2015 07/08/14   Huel Cote, MD    Family History Family History  Problem Relation Age of Onset  . Cancer Maternal Grandmother   . Diabetes Maternal Grandmother   . Cancer Paternal Grandfather     Social History Social History  Substance Use Topics  . Smoking status: Never Smoker  . Smokeless tobacco: Never Used  . Alcohol use No     Allergies   Penicillins   Review of Systems Review of Systems  Constitutional: Negative for fever.  HENT: Negative for congestion, dental problem and trouble swallowing.   Eyes: Negative for photophobia, pain, discharge, redness, itching and visual disturbance.  Respiratory: Negative for shortness of breath.   Cardiovascular: Negative for chest pain.  Gastrointestinal: Negative for abdominal pain.  Genitourinary: Negative for dysuria.  Skin: Positive for color change.  Allergic/Immunologic: Negative for immunocompromised state.  Neurological: Negative for headaches.     Physical Exam Updated Vital Signs BP 110/77 (BP Location: Left Arm)   Pulse 106   Temp 98.9 F (37.2 C) (Oral)   Resp 18   Ht 5\' 3"  (1.6 m)   Wt 63.5 kg   LMP 10/28/2015 (Approximate)   SpO2 100%   BMI 24.80 kg/m   Physical Exam  Constitutional: She is oriented to person, place, and time. She appears well-developed and well-nourished. No distress.  HENT:  Head: Normocephalic and  atraumatic.  Mouth/Throat: Oropharynx is clear and moist.  Eyes: Conjunctivae and EOM are normal. Pupils are equal, round, and reactive to light. Right eye exhibits no discharge. Left eye exhibits no discharge.  Cardiovascular: Normal rate, regular rhythm and normal heart sounds.   No murmur heard. Pulmonary/Chest: Effort normal and breath sounds normal. No respiratory distress.  Abdominal: Soft. She exhibits no distension. There is no tenderness.  Musculoskeletal: Normal range of motion.  Neurological: She is alert and oriented to person, place, and time.  Skin: Skin is warm and dry.  Well demarcated area of erythema to the right cheek and right aspect of the nose. + swelling. Not tender to palpation. No warmth. No focal area of swelling c/w abscess.   Nursing note and vitals reviewed.    ED Treatments / Results  Labs (all labs ordered are listed, but only abnormal results are displayed) Labs Reviewed  CBC WITH DIFFERENTIAL/PLATELET  BASIC METABOLIC PANEL  I-STAT CG4 LACTIC ACID, ED    EKG  EKG Interpretation None       Radiology Ct Maxillofacial W Contrast  Result Date: 11/15/2015 CLINICAL DATA:  35 year old female with possible insect bite on right cheek 1 week ago, now status post antibiotics, but worsening soft tissue swelling pain and redness. Initial encounter. EXAM: CT MAXILLOFACIAL WITH CONTRAST TECHNIQUE: Multidetector CT imaging of the maxillofacial structures was performed with intravenous contrast. Multiplanar CT image reconstructions were also generated. A small metallic BB was placed on the right temple in order to reliably differentiate right from left. CONTRAST:  1 ISOVUE-300 IOPAMIDOL (ISOVUE-300) INJECTION 61% COMPARISON:  None. FINDINGS: Osseous: No osseous abnormality identified. Orbits: Right orbit preseptal only inflammatory changes, see below. Right globe is intact. Postseptal right orbits soft tissues appear normal. The left orbit is normal. Sinuses: Clear.  Soft tissues: Visualized scalp soft tissues are within normal limits. Negative visualized thyroid, larynx, pharynx, parapharyngeal spaces, retropharyngeal space, sublingual space, submandibular glands and parotid glands. Widespread subcutaneous stranding and soft tissue swelling at the right premalar soft tissues. Mild thickening of the platysma continuing toward the right submandibular space, but no confluent right submandibular inflammation. Involvement of the preseptal right orbit soft tissues, more so inferiorly. No subcutaneous gas. No fluid collection. No lymphadenopathy. Limited intracranial:  Negative visualized brain parenchyma. Major vascular structures at the skullbase and in the visible neck are patent. IMPRESSION: Right premalar space and right orbit preseptal space cellulitis without abscess or complicating feature. Electronically Signed   By: Odessa FlemingH  Hall M.D.   On: 11/15/2015 14:05    Procedures Procedures (including critical care time)  Medications Ordered in ED Medications  iopamidol (ISOVUE-300) 61 % injection (75 mLs Intravenous Contrast Given 11/15/15 1358)     Initial Impression / Assessment and Plan / ED Course  I have reviewed the triage vital signs and the nursing notes.  Pertinent labs & imaging results that were available during my care of the  patient were reviewed by me and considered in my medical decision making (see chart for details).  Clinical Course    Tammie Atkinson is a 35 y.o. female who presents to ED for worsening right facial redness/swelling. Seen by urgent care yesterday where she was given a steroid shot and started on doxycyline. She has taken two doses of ABX with no relief in symptoms. On exam, patient is afebrile and nontoxic appearing. Not tachycardic on exam. Area is erythematous and swollen, however not tender or warm to the touch. Will obtain labs and CT maxillofacial for further evaluation.  Lactic and white count wdl. CT maxillofacial shows:    IMPRESSION: Right premalar space and right orbit preseptal space cellulitis without abscess or complicating feature.  Imaging results discussed with attending, Dr. Patria Maneampos.   Plan: continue doxycyline until completion, benadryl, daily photos to monitor improvement/changes, PCP follow up encouraged. Return precautions discussed. All questions answered.   Patient seen by and discussed with Dr. Patria Maneampos who agrees with treatment plan.   Final Clinical Impressions(s) / ED Diagnoses   Final diagnoses:  Preseptal cellulitis of right lower eyelid    New Prescriptions New Prescriptions   No medications on file     [facility] N[facility] Pilcher Ward, PA-C 11/15/15 1420    Azalia BilisKevin Campos, MD 11/15/15 1714

## 2015-11-15 NOTE — ED Notes (Signed)
Placed patient into a gown and on the monitor 

## 2015-11-15 NOTE — ED Notes (Signed)
Hooked patient back up to the monitor after CT

## 2015-12-05 ENCOUNTER — Ambulatory Visit: Payer: BC Managed Care – PPO | Admitting: Family Medicine

## 2016-04-09 ENCOUNTER — Ambulatory Visit (INDEPENDENT_AMBULATORY_CARE_PROVIDER_SITE_OTHER): Payer: BC Managed Care – PPO | Admitting: Adult Health

## 2016-04-09 ENCOUNTER — Encounter: Payer: Self-pay | Admitting: Adult Health

## 2016-04-09 DIAGNOSIS — R5383 Other fatigue: Secondary | ICD-10-CM | POA: Diagnosis not present

## 2016-04-09 DIAGNOSIS — Z0000 Encounter for general adult medical examination without abnormal findings: Secondary | ICD-10-CM | POA: Diagnosis not present

## 2016-04-09 NOTE — Progress Notes (Signed)
Subjective:    Patient ID: Tammie Atkinson, female    DOB: May 31, 1980, 36 y.o.   MRN: [id number]  HPI:  Tammie Atkinson is here to establish as a new pt.  She is a very pleasant, healthy 36 year old.  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  She was recently "saddened over registering her daughter for kindergarten" and felt a "little depressed".  She resumed regular running and OTC Fish Oil in the last month-which has greatly improved mood/outlook.  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 She denies tobacco use and reports 2 glasses of wine/week.   Patient Care Team    Relationship Specialty Notifications Start End  No Pcp Per Patient PCP - General General Practice  07/01/14     Patient Active Problem List   Diagnosis Date Noted  . S/P repeat low transverse C-section 07/06/2014     Past Medical History:  Diagnosis Date  . Heartburn during pregnancy   . Medical history non-contributory      Past Surgical History:  Procedure Laterality Date  . CESAREAN SECTION  06/08/2011   Procedure: CESAREAN SECTION;  Surgeon: Bing Plume, MD;  Location: [facility];  Service: Gynecology;  Laterality: N/A;  . CESAREAN SECTION N/A 07/06/2014   Procedure: CESAREAN SECTION;  Surgeon: Huel Cote, MD;  Location: [facility];  Service: Obstetrics;  Laterality: N/A;  . WISDOM TOOTH EXTRACTION       Family History  Problem Relation Age of Onset  . Depression Mother   . Hyperlipidemia Mother   . Hypertension Father   . Cancer Maternal Grandmother     bone, lung  . Diabetes Maternal Grandmother   . Cancer Paternal Grandfather     lung, prostate     History  Drug Use No     History  Alcohol Use  . 1.2 oz/week  . 1 Glasses of wine, 1 Cans of beer per week     History  Smoking Status  . Never Smoker  Smokeless Tobacco  . Never Used     Outpatient Encounter Prescriptions as of 04/09/2016   Medication Sig Note  . ibuprofen (ADVIL,MOTRIN) 600 MG tablet Take 1 tablet (600 mg total) by mouth every 6 (six) hours as needed for mild pain.   . Multiple Vitamin (MULTIVITAMIN) tablet Take 1 tablet by mouth daily.   . Omega-3 Fatty Acids (FISH OIL) 1000 MG CAPS Take 2 capsules by mouth daily.   . [DISCONTINUED] doxycycline (VIBRA-TABS) 100 MG tablet Take 100 mg by mouth 2 (two) times daily. 11/15/2015: Received from: External Pharmacy  . [DISCONTINUED] oxyCODONE-acetaminophen (PERCOCET/ROXICET) 5-325 MG per tablet Take 1 tablet by mouth every 4 (four) hours as needed (for pain scale 4-7). (Patient not taking: Reported on 11/15/2015)    No facility-administered encounter medications on file as of 04/09/2016.     Allergies: Penicillins  Body mass index is 25.11 kg/m.  Blood pressure 112/70, pulse 87, height  (1.626 m), weight 146 lb 4.8 oz (66.4 kg), last menstrual period 04/04/2016, unknown if currently breastfeeding.     Review of Systems  Constitutional: Negative for activity change, appetite change, chills, diaphoresis, fatigue, fever and unexpected weight change.  Eyes: Negative for visual disturbance.  Respiratory: Negative for cough, chest tightness, shortness of breath, wheezing and stridor.   Cardiovascular: Negative for chest pain, palpitations and leg swelling.  Gastrointestinal: Negative for  abdominal distention, abdominal pain, blood in stool, constipation, diarrhea, nausea and vomiting.  Endocrine: Negative for cold intolerance, heat intolerance, polydipsia, polyphagia and polyuria.  Genitourinary: Negative for difficulty urinating and flank pain.  Musculoskeletal: Negative for arthralgias, back pain, gait problem, joint swelling, myalgias, neck pain and neck stiffness.  Skin: Negative for color change, pallor, rash and wound.  Allergic/Immunologic: Negative for immunocompromised state.  Neurological: Negative for dizziness, tremors, weakness and numbness.   Hematological: Does not bruise/bleed easily.  Psychiatric/Behavioral: Negative for agitation, behavioral problems, confusion, decreased concentration, dysphoric mood, hallucinations, self-injury, sleep disturbance and suicidal ideas. The patient is not nervous/anxious and is not hyperactive.        Objective:   Physical Exam  Constitutional: She is oriented to person, place, and time. She appears well-developed and well-nourished. No distress.  HENT:  Head: Normocephalic and atraumatic.  Eyes: Conjunctivae are normal. Pupils are equal, round, and reactive to light.  Neck: Normal range of motion. Neck supple.  Cardiovascular: Normal rate, regular rhythm, normal heart sounds and intact distal pulses.   No murmur heard. Pulmonary/Chest: Effort normal and breath sounds normal. No respiratory distress. She has no wheezes. She has no rales. She exhibits no tenderness.  Abdominal: Soft. Bowel sounds are normal. She exhibits no distension and no mass. There is no tenderness. There is no rebound and no guarding.  Musculoskeletal: Normal range of motion.  Lymphadenopathy:    She has no cervical adenopathy.  Neurological: She is alert and oriented to person, place, and time. Coordination normal.  Skin: Skin is warm and dry. No rash noted. She is not diaphoretic. No erythema. No pallor.  Psychiatric: She has a normal mood and affect. Her behavior is normal. Judgment and thought content normal.  Nursing note and vitals reviewed.         Assessment & Plan:   1. Fatigue, unspecified type   2. Health care maintenance     Fatigue Will check Vit d and TSH levels. Continue regular exercise and healthy eating.  Health care maintenance  Continue regular exercise. Heart Healthy Diet. Please schedule fasting nurse visit at your convenience. Annual follow-up, sooner if needed.    FOLLOW-UP:  Return in about 1 year (around 04/09/2017) for Regular Follow Up.

## 2016-04-09 NOTE — Patient Instructions (Signed)
Heart-Healthy Eating Plan Many factors influence your heart health, including eating and exercise habits. Heart (coronary) risk increases with abnormal blood fat (lipid) levels. Heart-healthy meal planning includes limiting unhealthy fats, increasing healthy fats, and making other small dietary changes. This includes maintaining a healthy body weight to help keep lipid levels within a normal range. What is my plan? Your health care provider recommends that you:  Get no more than _________% of the total calories in your daily diet from fat.  Limit your intake of saturated fat to less than _________% of your total calories each day.  Limit the amount of cholesterol in your diet to less than _________ mg per day. What types of fat should I choose?  Choose healthy fats more often. Choose monounsaturated and polyunsaturated fats, such as olive oil and canola oil, flaxseeds, walnuts, almonds, and seeds.  Eat more omega-3 fats. Good choices include salmon, mackerel, sardines, tuna, flaxseed oil, and ground flaxseeds. Aim to eat fish at least two times each week.  Limit saturated fats. Saturated fats are primarily found in animal products, such as meats, butter, and cream. Plant sources of saturated fats include palm oil, palm kernel oil, and coconut oil.  Avoid foods with partially hydrogenated oils in them. These contain trans fats. Examples of foods that contain trans fats are stick margarine, some tub margarines, cookies, crackers, and other baked goods. What general guidelines do I need to follow?  Check food labels carefully to identify foods with trans fats or high amounts of saturated fat.  Fill one half of your plate with vegetables and green salads. Eat 4-5 servings of vegetables per day. A serving of vegetables equals 1 cup of raw leafy vegetables,  cup of raw or cooked cut-up vegetables, or  cup of vegetable juice.  Fill one fourth of your plate with whole grains. Look for the word  "whole" as the first word in the ingredient list.  Fill one fourth of your plate with lean protein foods.  Eat 4-5 servings of fruit per day. A serving of fruit equals one medium whole fruit,  cup of dried fruit,  cup of fresh, frozen, or canned fruit, or  cup of 100% fruit juice.  Eat more foods that contain soluble fiber. Examples of foods that contain this type of fiber are apples, broccoli, carrots, beans, peas, and barley. Aim to get 20-30 g of fiber per day.  Eat more home-cooked food and less restaurant, buffet, and fast food.  Limit or avoid alcohol.  Limit foods that are high in starch and sugar.  Avoid fried foods.  Cook foods by using methods other than frying. Baking, boiling, grilling, and broiling are all great options. Other fat-reducing suggestions include:  Removing the skin from poultry.  Removing all visible fats from meats.  Skimming the fat off of stews, soups, and gravies before serving them.  Steaming vegetables in water or broth.  Lose weight if you are overweight. Losing just 5-10% of your initial body weight can help your overall health and prevent diseases such as diabetes and heart disease.  Increase your consumption of nuts, legumes, and seeds to 4-5 servings per week. One serving of dried beans or legumes equals  cup after being cooked, one serving of nuts equals 1 ounces, and one serving of seeds equals  ounce or 1 tablespoon.  You may need to monitor your salt (sodium) intake, especially if you have high blood pressure. Talk with your health care provider or dietitian to get more  information about reducing sodium. What foods can I eat? Grains   Breads, including [place], white, pita, wheat, raisin, rye, oatmeal, and [place]. Tortillas that are neither fried nor made with lard or trans fat. Low-fat rolls, including hotdog and hamburger buns and English muffins. Biscuits. Muffins. Waffles. Pancakes. Light popcorn. Whole-grain cereals. Flatbread.  Melba toast. Pretzels. Breadsticks. Rusks. Low-fat snacks and crackers, including oyster, saltine, matzo, graham, animal, and rye. Rice and pasta, including [place] rice and those that are made with whole wheat. Vegetables  All vegetables. Fruits  All fruits, but limit coconut. Meats and Other Protein Sources  Lean, well-trimmed beef, veal, pork, and lamb. Chicken and [place] without skin. All fish and shellfish. Wild duck, rabbit, pheasant, and venison. Egg whites or low-cholesterol egg substitutes. Dried beans, peas, lentils, and tofu.Seeds and most nuts. Dairy  Low-fat or nonfat cheeses, including ricotta, string, and mozzarella. Skim or 1% milk that is liquid, powdered, or evaporated. Buttermilk that is made with low-fat milk. Nonfat or low-fat yogurt. Beverages  Mineral water. Diet carbonated beverages. Sweets and Desserts  Sherbets and fruit ices. Honey, jam, marmalade, jelly, and syrups. Meringues and gelatins. Pure sugar candy, such as hard candy, jelly beans, gumdrops, mints, marshmallows, and small amounts of dark chocolate. [employer] Eat all sweets and desserts in moderation. Fats and Oils  Nonhydrogenated (trans-free) margarines. Vegetable oils, including soybean, sesame, sunflower, olive, peanut, safflower, corn, canola, and cottonseed. Salad dressings or mayonnaise that are made with a vegetable oil. Limit added fats and oils that you use for cooking, baking, salads, and as spreads. Other  Cocoa powder. Coffee and tea. All seasonings and condiments. The items listed above may not be a complete list of recommended foods or beverages. Contact your dietitian for more options.  What foods are not recommended? Grains  Breads that are made with saturated or trans fats, oils, or whole milk. Croissants. Butter rolls. Cheese breads. Sweet rolls. Donuts. Buttered popcorn. Chow mein noodles. High-fat crackers, such as cheese or butter crackers. Meats and Other Protein Sources  Fatty  meats, such as hotdogs, short ribs, sausage, spareribs, bacon, ribeye roast or steak, and mutton. High-fat deli meats, such as salami and bologna. Caviar. Domestic duck and goose. Organ meats, such as kidney, liver, sweetbreads, brains, gizzard, chitterlings, and heart. Dairy  Cream, sour cream, cream cheese, and creamed cottage cheese. Whole milk cheeses, including blue (bleu), Monterey Jack, [place], [place], American, [place], Swiss, [place], [place], and [place]. Whole or 2% milk that is liquid, evaporated, or condensed. Whole buttermilk. Cream sauce or high-fat cheese sauce. Yogurt that is made from whole milk. Beverages  Regular sodas and drinks with added sugar. Sweets and Desserts  Frosting. Pudding. Cookies. Cakes other than angel food cake. Candy that has milk chocolate or white chocolate, hydrogenated fat, butter, coconut, or unknown ingredients. Buttered syrups. Full-fat ice cream or ice cream drinks. Fats and Oils  Gravy that has suet, meat fat, or shortening. Cocoa butter, hydrogenated oils, palm oil, coconut oil, palm kernel oil. These can often be found in baked products, candy, fried foods, nondairy creamers, and whipped toppings. Solid fats and shortenings, including bacon fat, salt pork, lard, and butter. Nondairy cream substitutes, such as coffee creamers and sour cream substitutes. Salad dressings that are made of unknown oils, cheese, or sour cream. The items listed above may not be a complete list of foods and beverages to avoid. Contact your dietitian for more information.  This information is not intended to replace advice given to you by  your health care provider. Make sure you discuss any questions you have with your health care provider. Document Released: 09/27/2007 Document Revised: 07/08/2015 Document Reviewed: 06/11/2013 Elsevier Interactive Patient Education  2017 [employer].  Continue regular exercise. Please schedule fasting nurse visit at your convenience. Annual  follow-up, sooner if needed.

## 2016-04-09 NOTE — Assessment & Plan Note (Signed)
  Continue regular exercise. Heart Healthy Diet. Please schedule fasting nurse visit at your convenience. Annual follow-up, sooner if needed.

## 2016-04-09 NOTE — Assessment & Plan Note (Signed)
Will check Vit d and TSH levels. Continue regular exercise and healthy eating.

## 2016-04-25 ENCOUNTER — Other Ambulatory Visit: Payer: Self-pay

## 2016-04-25 DIAGNOSIS — Z833 Family history of diabetes mellitus: Secondary | ICD-10-CM

## 2016-04-25 DIAGNOSIS — Z83438 Family history of other disorder of lipoprotein metabolism and other lipidemia: Secondary | ICD-10-CM

## 2016-04-25 DIAGNOSIS — Z0000 Encounter for general adult medical examination without abnormal findings: Secondary | ICD-10-CM

## 2016-04-25 DIAGNOSIS — Z8249 Family history of ischemic heart disease and other diseases of the circulatory system: Secondary | ICD-10-CM

## 2016-04-25 DIAGNOSIS — R5383 Other fatigue: Secondary | ICD-10-CM

## 2016-04-27 ENCOUNTER — Other Ambulatory Visit (INDEPENDENT_AMBULATORY_CARE_PROVIDER_SITE_OTHER): Payer: BC Managed Care – PPO

## 2016-04-27 DIAGNOSIS — Z833 Family history of diabetes mellitus: Secondary | ICD-10-CM

## 2016-04-27 DIAGNOSIS — Z8249 Family history of ischemic heart disease and other diseases of the circulatory system: Secondary | ICD-10-CM

## 2016-04-27 DIAGNOSIS — R5383 Other fatigue: Secondary | ICD-10-CM

## 2016-04-27 DIAGNOSIS — Z83438 Family history of other disorder of lipoprotein metabolism and other lipidemia: Secondary | ICD-10-CM

## 2016-04-27 DIAGNOSIS — Z0000 Encounter for general adult medical examination without abnormal findings: Secondary | ICD-10-CM

## 2016-04-28 LAB — COMPREHENSIVE METABOLIC PANEL
ALBUMIN: 4.6 g/dL (ref 3.5–5.5)
ALK PHOS: 59 IU/L (ref 39–117)
ALT: 13 IU/L (ref 0–32)
AST: 14 IU/L (ref 0–40)
Albumin/Globulin Ratio: 1.8 (ref 1.2–2.2)
BUN / CREAT RATIO: 12 (ref 9–23)
BUN: 10 mg/dL (ref 6–20)
Bilirubin Total: 0.4 mg/dL (ref 0.0–1.2)
CALCIUM: 9.3 mg/dL (ref 8.7–10.2)
CO2: 23 mmol/L (ref 18–29)
CREATININE: 0.85 mg/dL (ref 0.57–1.00)
Chloride: 105 mmol/L (ref 96–106)
GFR calc Af Amer: 104 mL/min/{1.73_m2} (ref >59)
GFR, EST NON AFRICAN AMERICAN: 90 mL/min/{1.73_m2} (ref >59)
GLOBULIN, TOTAL: 2.5 g/dL (ref 1.5–4.5)
GLUCOSE: 89 mg/dL (ref 65–99)
Potassium: 5.6 mmol/L — ABNORMAL HIGH (ref 3.5–5.2)
SODIUM: 143 mmol/L (ref 134–144)
Total Protein: 7.1 g/dL (ref 6.0–8.5)

## 2016-04-28 LAB — LIPID PANEL
Chol/HDL Ratio: 3.4 ratio (ref 0.0–4.4)
Cholesterol, Total: 171 mg/dL (ref 100–199)
HDL: 51 mg/dL (ref >39)
LDL Calculated: 107 mg/dL — ABNORMAL HIGH (ref 0–99)
TRIGLYCERIDES: 63 mg/dL (ref 0–149)
VLDL CHOLESTEROL CAL: 13 mg/dL (ref 5–40)

## 2016-04-28 LAB — CBC WITH DIFFERENTIAL/PLATELET
BASOS ABS: 0 10*3/uL (ref 0.0–0.2)
Basos: 1 %
EOS (ABSOLUTE): 0.1 10*3/uL (ref 0.0–0.4)
EOS: 2 %
HEMATOCRIT: 41.2 % (ref 34.0–46.6)
HEMOGLOBIN: 13.5 g/dL (ref 11.1–15.9)
IMMATURE GRANS (ABS): 0 10*3/uL (ref 0.0–0.1)
IMMATURE GRANULOCYTES: 0 %
LYMPHS: 38 %
Lymphocytes Absolute: 2.2 10*3/uL (ref 0.7–3.1)
MCH: 29.1 pg (ref 26.6–33.0)
MCHC: 32.8 g/dL (ref 31.5–35.7)
MCV: 89 fL (ref 79–97)
MONOCYTES: 5 %
Monocytes Absolute: 0.3 10*3/uL (ref 0.1–0.9)
NEUTROS PCT: 54 %
Neutrophils Absolute: 3.1 10*3/uL (ref 1.4–7.0)
Platelets: 247 10*3/uL (ref 150–379)
RBC: 4.64 x10E6/uL (ref 3.77–5.28)
RDW: 13.3 % (ref 12.3–15.4)
WBC: 5.8 10*3/uL (ref 3.4–10.8)

## 2016-04-28 LAB — VITAMIN D 25 HYDROXY (VIT D DEFICIENCY, FRACTURES): Vit D, 25-Hydroxy: 38.5 ng/mL (ref 30.0–100.0)

## 2016-04-28 LAB — HEMOGLOBIN A1C
ESTIMATED AVERAGE GLUCOSE: 97 mg/dL
HEMOGLOBIN A1C: 5 % (ref 4.8–5.6)

## 2016-04-28 LAB — TSH: TSH: 2.61 u[IU]/mL (ref 0.450–4.500)

## 2016-05-04 ENCOUNTER — Telehealth: Payer: Self-pay | Admitting: Adult Health

## 2016-05-04 NOTE — Telephone Encounter (Signed)
Pt called states she as more question about her Labs results and wants nurse/ CMA to call her on Monday-- Ph# [phone]. --glh

## 2016-05-07 NOTE — Telephone Encounter (Signed)
Good Afternoon Marchelle FolksAmanda, Can you please call Ms. Rasch and share: Yes elevated potassium commonly occurs when someone has Addisons Disease. However it is typically extremely elevated potassium and a low sodium leve. Your sodium level was perfect at 143 (normal 134-144) and your potassium was only slightly elevated at 5.6 (normal 3.5-5.2). Are you having any sx's? We can repeat labs in a few months. Thanks! Orpha BurKaty

## 2016-05-07 NOTE — Telephone Encounter (Signed)
Pt called and stated that her mother has Addison's Disease and wants to know if her elevated potassium level could possibly mean that she may be developing Addison's Disease. Please advise?

## 2016-05-07 NOTE — Telephone Encounter (Signed)
LVM requesting pt to call the office.  

## 2016-05-08 ENCOUNTER — Telehealth: Payer: Self-pay | Admitting: Adult Health

## 2016-05-08 NOTE — Telephone Encounter (Signed)
Patient has questions about her lab work and would like to speak to someone clinical.

## 2016-05-08 NOTE — Telephone Encounter (Signed)
Discussed with patient.  Pt expressed understanding.  T. Ligia Duguay, CMA 

## 2016-05-08 NOTE — Telephone Encounter (Signed)
Discussed with patient.  Pt expressed understanding.  Tammie Atkinson. Nelson, CMA

## 2017-01-16 ENCOUNTER — Encounter: Payer: Self-pay | Admitting: Adult Health

## 2017-01-16 ENCOUNTER — Ambulatory Visit (INDEPENDENT_AMBULATORY_CARE_PROVIDER_SITE_OTHER): Payer: BC Managed Care – PPO | Admitting: Adult Health

## 2017-01-16 VITALS — BP 113/72 | HR 100 | Temp 98.6°F | Ht 64.0 in | Wt 153.8 lb

## 2017-01-16 DIAGNOSIS — R509 Fever, unspecified: Secondary | ICD-10-CM | POA: Diagnosis not present

## 2017-01-16 DIAGNOSIS — J029 Acute pharyngitis, unspecified: Secondary | ICD-10-CM | POA: Insufficient documentation

## 2017-01-16 LAB — POCT RAPID STREP A (OFFICE): RAPID STREP A SCREEN: POSITIVE — AB

## 2017-01-16 MED ORDER — AZITHROMYCIN 250 MG PO TABS: ORAL_TABLET | ORAL | 0 refills | Status: DC

## 2017-01-16 NOTE — Assessment & Plan Note (Signed)
Strep test + PCN allergy, placed on Z pack Increase fluids/rest/vit c-2,0326m/day Alternate OTC Acetaminophen and Ibuprofen If symptoms persist after antibiotic completed, please call clinic.  Work excuse provided, okay to return 01/21/17.

## 2017-01-16 NOTE — Addendum Note (Signed)
Addended by: William HamburgerANFORD, Jaccob Czaplicki D on: 01/16/2017 05:01 PM   Modules accepted: Orders

## 2017-01-16 NOTE — Progress Notes (Signed)
Subjective:    Patient ID: Tammie Atkinson, female    DOB: 12/09/1980, 37 y.o.   MRN: [id number]  HPI:  Tammie Atkinson presents with sore throat that worsens with speaking and swallowing, rated 7/10 and started yesterday.  She had low grade fever this am- 101 and took a dose of Ibuprofen this am. 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 She denies CP/dyspnea/cough/palpiations/N/V/D  Patient Care Team    Relationship Specialty Notifications Start End  Julaine Fusi, NP PCP - General Family Medicine  04/27/16     Patient Active Problem List   Diagnosis Date Noted  . Fatigue 04/09/2016  . Health care maintenance 04/09/2016  . S/P repeat low transverse C-section 07/06/2014     Past Medical History:  Diagnosis Date  . Heartburn during pregnancy   . Medical history non-contributory      Past Surgical History:  Procedure Laterality Date  . CESAREAN SECTION  06/08/2011   Procedure: CESAREAN SECTION;  Surgeon: Bing Plume, MD;  Location: [facility];  Service: Gynecology;  Laterality: N/A;  . CESAREAN SECTION N/A 07/06/2014   Procedure: CESAREAN SECTION;  Surgeon: Huel Cote, MD;  Location: [facility];  Service: Obstetrics;  Laterality: N/A;  . WISDOM TOOTH EXTRACTION       Family History  Problem Relation Age of Onset  . Depression Mother   . Hyperlipidemia Mother   . Hypertension Father   . Cancer Maternal Grandmother        bone, lung  . Diabetes Maternal Grandmother   . Cancer Paternal Grandfather        lung, prostate     Social History   Substance and Sexual Activity  Drug Use No     Social History   Substance and Sexual Activity  Alcohol Use Yes  . Alcohol/week: 1.2 oz  . Types: 1 Glasses of wine, 1 Cans of beer per week     Social History   Tobacco Use  Smoking Status Never Smoker  Smokeless Tobacco Never Used     Outpatient Encounter Medications as of 01/16/2017  Medication Sig  . ibuprofen (ADVIL,MOTRIN) 600  MG tablet Take 1 tablet (600 mg total) by mouth every 6 (six) hours as needed for mild pain.  . Multiple Vitamin (MULTIVITAMIN) tablet Take 1 tablet by mouth daily.  . [DISCONTINUED] Omega-3 Fatty Acids (FISH OIL) 1000 MG CAPS Take 2 capsules by mouth daily.   No facility-administered encounter medications on file as of 01/16/2017.     Allergies: Penicillins  Body mass index is 26.4 kg/m.  Blood pressure 113/72, pulse 100, temperature 98.6 F (37 C), temperature source Oral, height 5\' 4"  (1.626 m), weight 153 lb 12.8 oz (69.8 kg), last menstrual period 12/27/2016, SpO2 100 %, not currently breastfeeding.     Review of Systems  Constitutional: Positive for appetite change, chills and fever. Negative for activity change, diaphoresis, fatigue and unexpected weight change.  HENT: Positive for sore throat, trouble swallowing and voice change. Negative for congestion and postnasal drip.   Eyes: Negative for visual disturbance.  Respiratory: Negative for cough, chest tightness, shortness of breath, wheezing and stridor.   Cardiovascular: Negative for chest pain, palpitations and leg swelling.  Gastrointestinal: Negative for abdominal distention, abdominal pain, blood in stool, constipation, diarrhea, nausea and vomiting.  Neurological: Negative for dizziness and headaches.  Hematological: Does not bruise/bleed easily.       Objective:   Physical Exam  Constitutional:  She is oriented to person, place, and time. She appears well-developed and well-nourished. No distress.  HENT:  Head: Normocephalic and atraumatic.  Right Ear: Hearing, tympanic membrane, external ear and ear canal normal. Tympanic membrane is not erythematous and not bulging. No decreased hearing is noted.  Left Ear: Tympanic membrane, external ear and ear canal normal. Tympanic membrane is not erythematous and not bulging. No decreased hearing is noted.  Nose: Nose normal. Right sinus exhibits no maxillary sinus  tenderness and no frontal sinus tenderness. Left sinus exhibits no maxillary sinus tenderness and no frontal sinus tenderness.  Mouth/Throat: Uvula is midline and mucous membranes are normal. Oropharyngeal exudate, posterior oropharyngeal edema, posterior oropharyngeal erythema and tonsillar abscesses present.  Eyes: Conjunctivae are normal. Pupils are equal, round, and reactive to light.  Neck: Normal range of motion. Neck supple.  Cardiovascular: Normal rate, regular rhythm, normal heart sounds and intact distal pulses.  No murmur heard. Pulmonary/Chest: Effort normal and breath sounds normal. No respiratory distress. She has no wheezes. She has no rales. She exhibits no tenderness.  Lymphadenopathy:    She has no cervical adenopathy.  Neurological: She is alert and oriented to person, place, and time.  Skin: Skin is warm. No rash noted. She is diaphoretic. No erythema. No pallor.  Hot to the touch  Psychiatric: She has a normal mood and affect. Her behavior is normal. Judgment and thought content normal.          Assessment & Plan:   1. Sore throat   2. Fever and chills     Sore throat Strep test + PCN allergy, placed on Z pack Increase fluids/rest/vit c-2,07364m/day Alternate OTC Acetaminophen and Ibuprofen If symptoms persist after antibiotic completed, please call clinic.  Work excuse provided, okay to return 01/21/17.    FOLLOW-UP:  Return if symptoms worsen or fail to improve.

## 2017-01-16 NOTE — Patient Instructions (Addendum)
Strep Throat Strep throat is a bacterial infection of the throat. Your health care provider may call the infection tonsillitis or pharyngitis, depending on whether there is swelling in the tonsils or at the back of the throat. Strep throat is most common during the cold months of the year in children who are 5-35 years of age, but it can happen during any season in people of any age. This infection is spread from person to person (contagious) through coughing, sneezing, or close contact. What are the causes? Strep throat is caused by the bacteria called Streptococcus pyogenes. What increases the risk? This condition is more likely to develop in:  People who spend time in crowded places where the infection can spread easily.  People who have close contact with someone who has strep throat.  What are the signs or symptoms? Symptoms of this condition include:  Fever or chills.  Redness, swelling, or pain in the tonsils or throat.  Pain or difficulty when swallowing.  White or yellow spots on the tonsils or throat.  Swollen, tender glands in the neck or under the jaw.  Red rash all over the body (rare).  How is this diagnosed? This condition is diagnosed by performing a rapid strep test or by taking a swab of your throat (throat culture test). Results from a rapid strep test are usually ready in a few minutes, but throat culture test results are available after one or two days. How is this treated? This condition is treated with antibiotic medicine. Follow these instructions at home: Medicines  Take over-the-counter and prescription medicines only as told by your health care provider.  Take your antibiotic as told by your health care provider. Do not stop taking the antibiotic even if you start to feel better.  Have family members who also have a sore throat or fever tested for strep throat. They may need antibiotics if they have the strep infection. Eating and drinking  Do not  share food, drinking cups, or personal items that could cause the infection to spread to other people.  If swallowing is difficult, try eating soft foods until your sore throat feels better.  Drink enough fluid to keep your urine clear or pale yellow. General instructions  Gargle with a salt-water mixture 3-4 times per day or as needed. To make a salt-water mixture, completely dissolve -1 tsp of salt in 1 cup of warm water.  Make sure that all household members wash their hands well.  Get plenty of rest.  Stay home from school or work until you have been taking antibiotics for 24 hours.  Keep all follow-up visits as told by your health care provider. This is important. Contact a health care provider if:  The glands in your neck continue to get bigger.  You develop a rash, cough, or earache.  You cough up a thick liquid that is green, yellow-brown, or bloody.  You have pain or discomfort that does not get better with medicine.  Your problems seem to be getting worse rather than better.  You have a fever. Get help right away if:  You have new symptoms, such as vomiting, severe headache, stiff or painful neck, chest pain, or shortness of breath.  You have severe throat pain, drooling, or changes in your voice.  You have swelling of the neck, or the skin on the neck becomes red and tender.  You have signs of dehydration, such as fatigue, dry mouth, and decreased urination.  You become increasingly sleepy, or   you cannot wake up completely.  Your joints become red or painful. This information is not intended to replace advice given to you by your health care provider. Make sure you discuss any questions you have with your health care provider. Document Released: 12/16/1999 Document Revised: 08/17/2015 Document Reviewed: 04/12/2014 Elsevier Interactive Patient Education  2018 [employer]  Strep test + PCN allergy, placed on Z pack Increase fluids/rest/vit  c-2,05159m/day Alternate OTC Acetaminophen and Ibuprofen If symptoms persist after antibiotic completed, please call clinic.  Work excuse provided, okay to return 01/21/17. FEEL BETTER!

## 2017-09-06 IMAGING — CT CT MAXILLOFACIAL W/ CM
3 series · 15 of 47 positions shown, 18 images · IV contrast (iopamidol)
Comparison: None.

CLINICAL DATA: 35-year-old female with possible insect bite on
right cheek 1 week ago, now status post antibiotics, but worsening
soft tissue swelling pain and redness. Initial encounter.

EXAM:
CT MAXILLOFACIAL WITH CONTRAST
TECHNIQUE: Multidetector CT imaging of the maxillofacial structures was
performed with intravenous contrast. Multiplanar CT image
reconstructions were also generated. A small metallic BB was placed
on the right temple in order to reliably differentiate right from
left.
CONTRAST:  1 HJGEI8-WRR IOPAMIDOL (HJGEI8-WRR) INJECTION 61%

[Series 3: facialbone 2.0 st · axial · 0.29mm/px · z∈[+1049,+1187]mm · 9 of 81 slices shown, 12 images]
[im 6/81  brain]
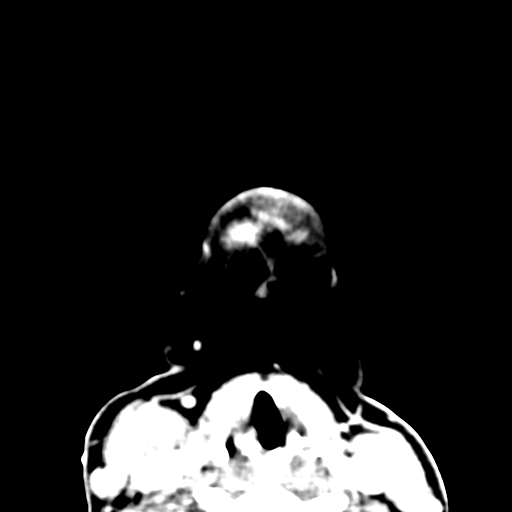
[im 6/81  bone]
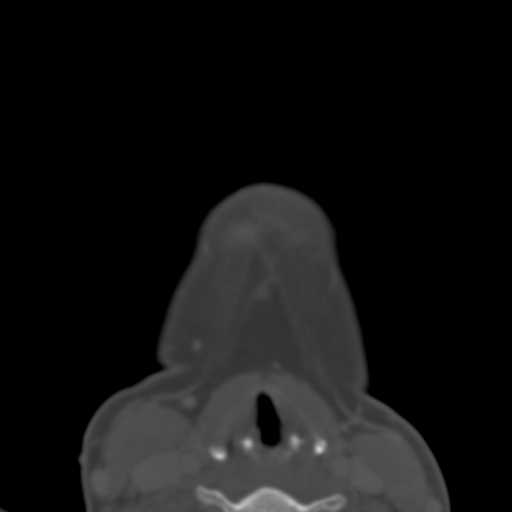
[im 14/81  bone]
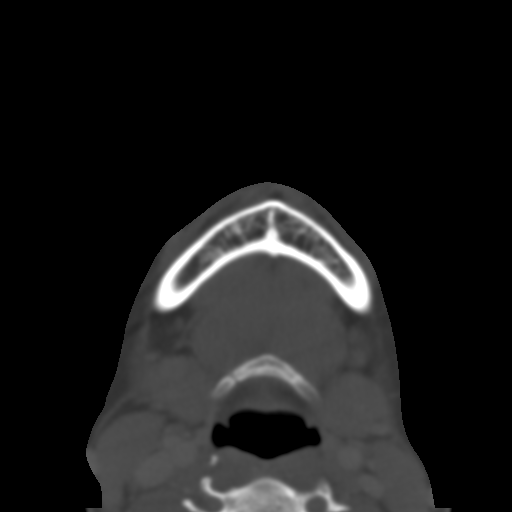
[im 23/81  bone]
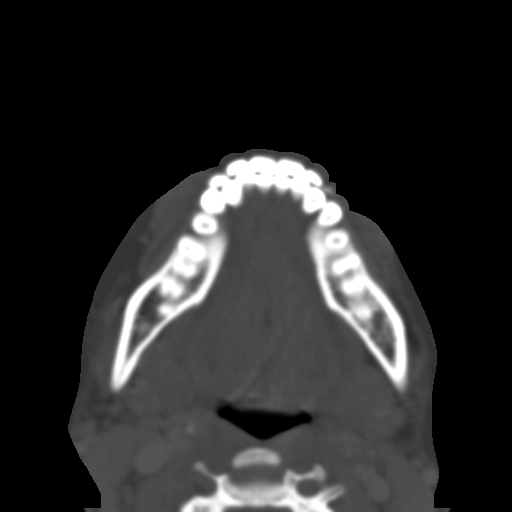
[im 31/81  bone]
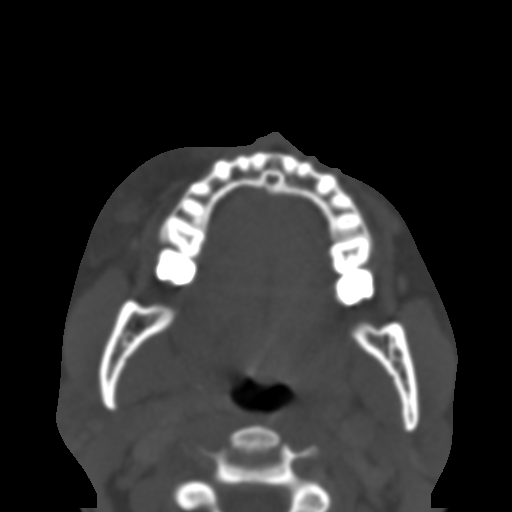
[im 42/81  brain]
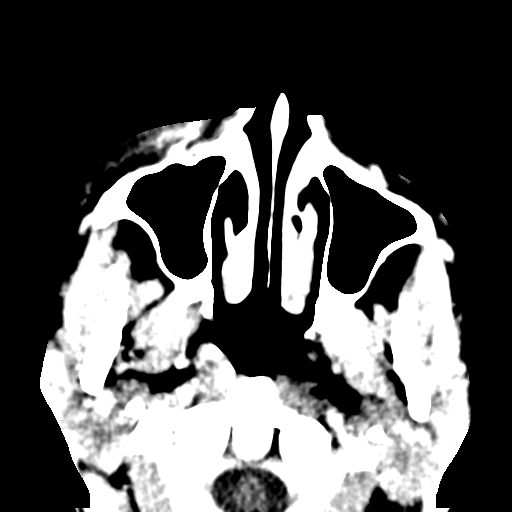
[im 42/81  bone]
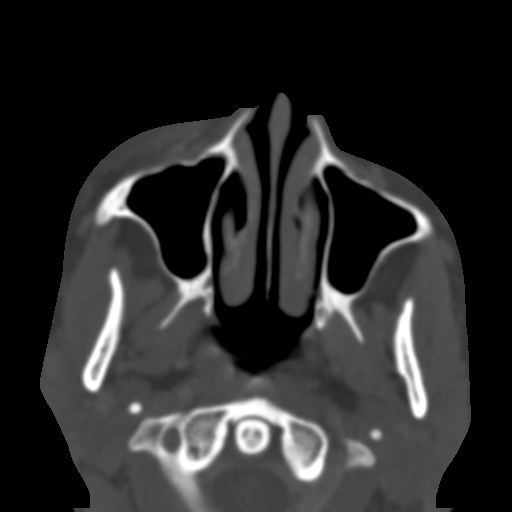
[im 50/81  bone]
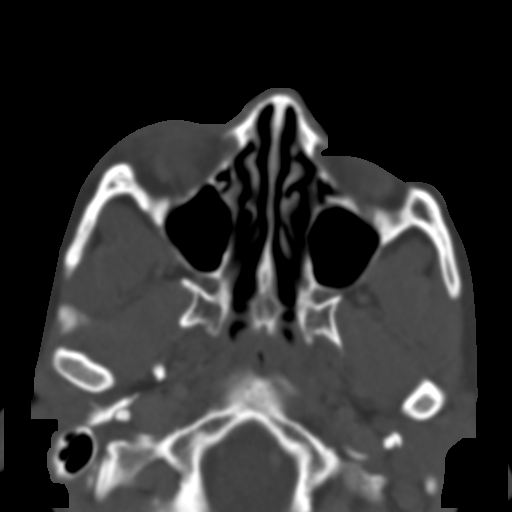
[im 58/81  bone]
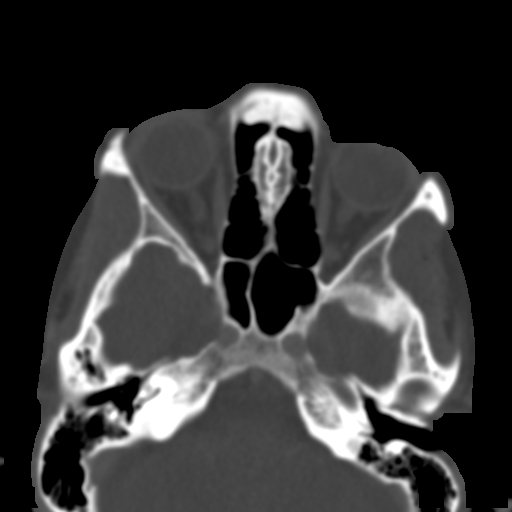
[im 67/81  bone]
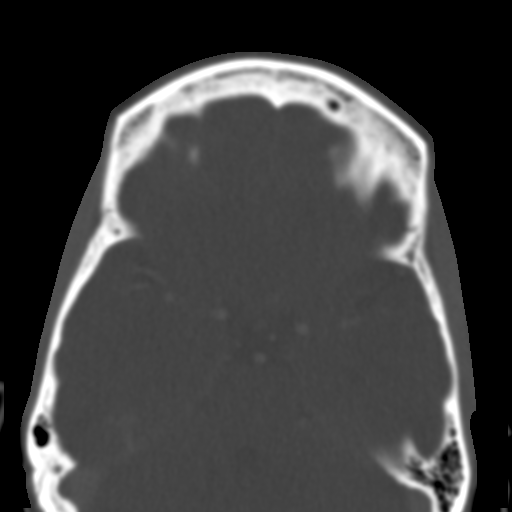
[im 75/81  brain]
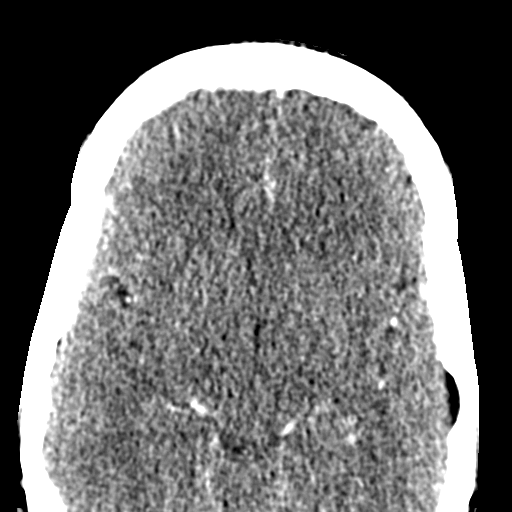
[im 75/81  bone]
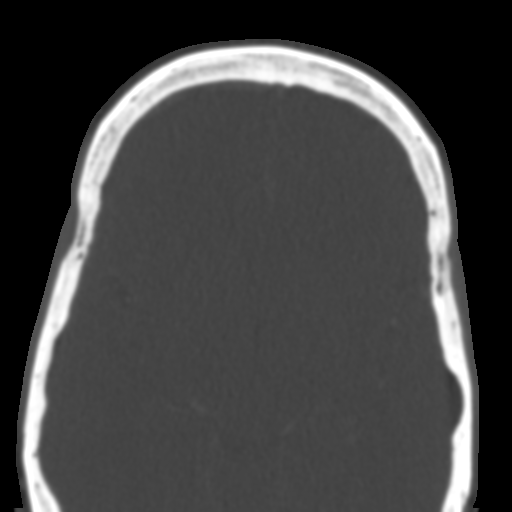

[Series 7: facialbone 2.0 cor st · coronal · 0.35mm/px · 3 of 95 slices shown]
[im 32/95  bone]
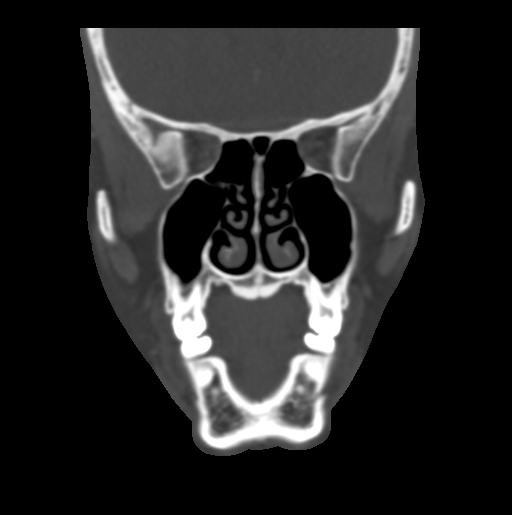
[im 42/95  bone]
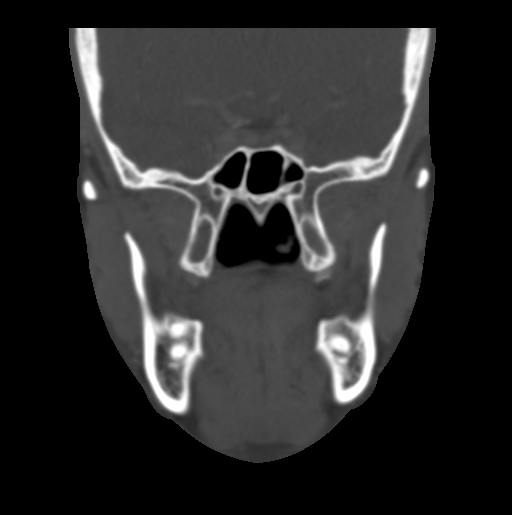
[im 53/95  bone]
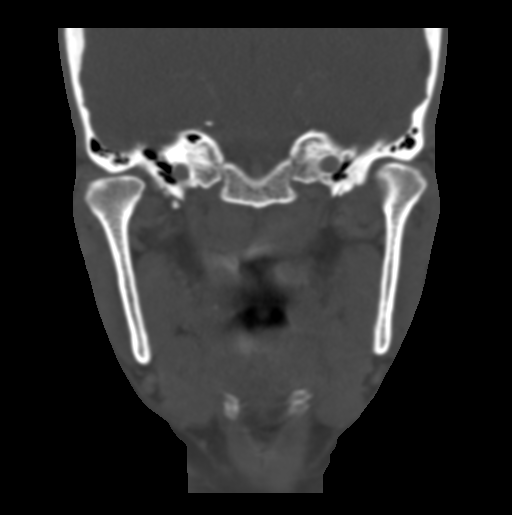

[Series 8: facialbone 2.0 sag st · sagittal · 0.41mm/px · 3 of 76 slices shown]
[im 26/76  bone]
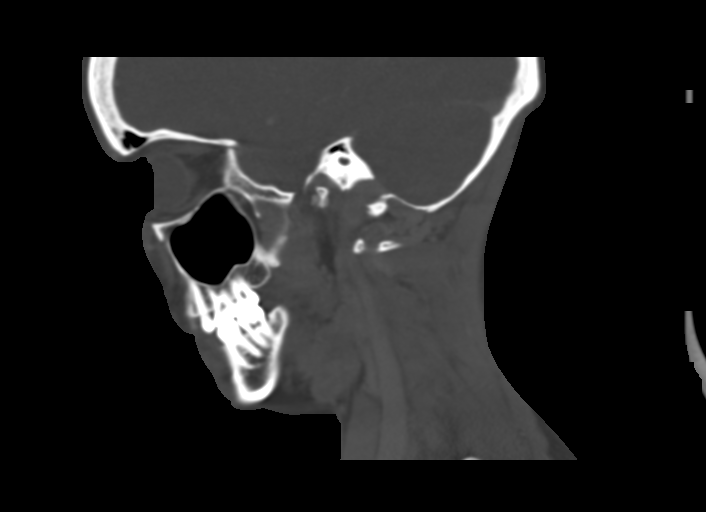
[im 38/76  bone]
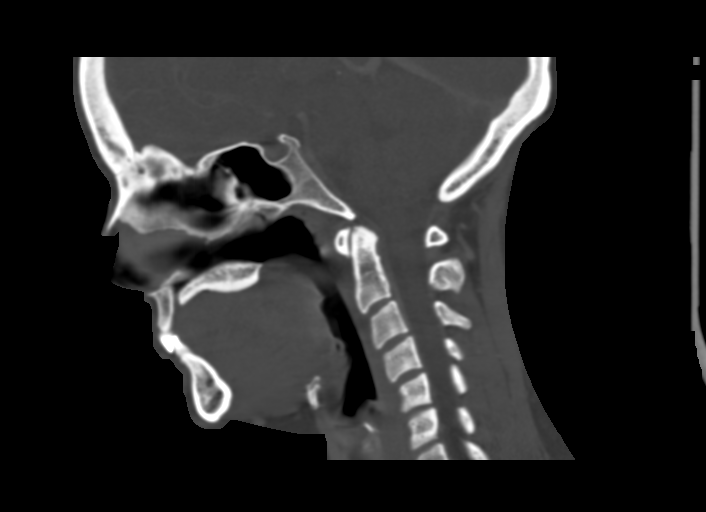
[im 51/76  bone]
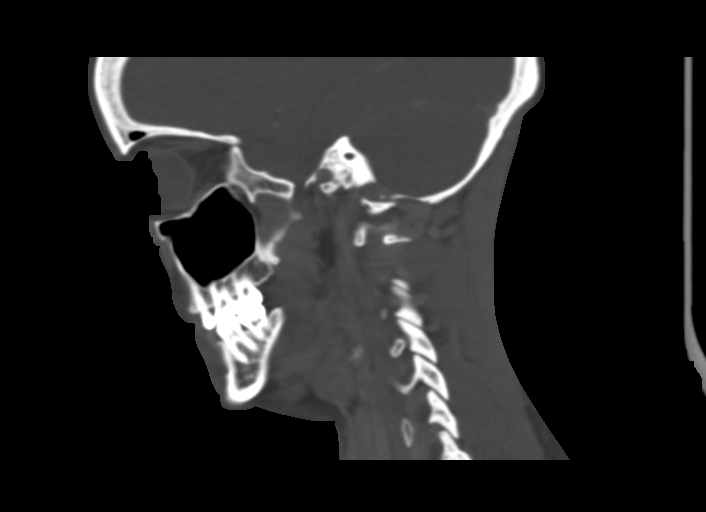

[15 of 47 positions shown; findings below may reference images not displayed]

FINDINGS: Osseous: No osseous abnormality identified.

Orbits: Right orbit preseptal only inflammatory changes, see below.
Right globe is intact. Postseptal right orbits soft tissues appear
normal. The left orbit is normal.

Sinuses: Clear.

Soft tissues: Visualized scalp soft tissues are within normal
limits.

Negative visualized thyroid, larynx, pharynx, parapharyngeal spaces,
retropharyngeal space, sublingual space, submandibular glands and
parotid glands.

Widespread subcutaneous stranding and soft tissue swelling at the
right premalar soft tissues. Mild thickening of the platysma
continuing toward the right submandibular space, but no confluent
right submandibular inflammation. Involvement of the preseptal right
orbit soft tissues, more so inferiorly. No subcutaneous gas. No
fluid collection.

No lymphadenopathy.

Limited intracranial:  Negative visualized brain parenchyma.

Major vascular structures at the skullbase and in the visible neck
are patent.
IMPRESSION: Right premalar space and right orbit preseptal space cellulitis
without abscess or complicating feature.

## 2017-10-23 ENCOUNTER — Ambulatory Visit (INDEPENDENT_AMBULATORY_CARE_PROVIDER_SITE_OTHER): Payer: BC Managed Care – PPO | Admitting: Adult Health

## 2017-10-23 ENCOUNTER — Encounter: Payer: Self-pay | Admitting: Adult Health

## 2017-10-23 VITALS — BP 102/65 | HR 78 | Temp 98.7°F | Ht 64.0 in | Wt 153.1 lb

## 2017-10-23 DIAGNOSIS — J029 Acute pharyngitis, unspecified: Secondary | ICD-10-CM | POA: Diagnosis not present

## 2017-10-23 DIAGNOSIS — R509 Fever, unspecified: Secondary | ICD-10-CM | POA: Diagnosis not present

## 2017-10-23 LAB — POCT RAPID STREP A (OFFICE): Rapid Strep A Screen: POSITIVE — AB

## 2017-10-23 MED ORDER — PREDNISONE 20 MG PO TABS: ORAL_TABLET | ORAL | 0 refills | Status: DC

## 2017-10-23 MED ORDER — AZITHROMYCIN 250 MG PO TABS: ORAL_TABLET | ORAL | 0 refills | Status: DC

## 2017-10-23 NOTE — Progress Notes (Signed)
Subjective:    Patient ID: Tammie Atkinson, female    DOB: Dec 29, 1980, 37 y.o.   MRN: [id number]  HPI:  Tammie Atkinson presents with excessive sore throat 10/10 that started 4 days ago and low grade fever 3 days ago (highest 100.5 f). She denies nasal drainage/cough/N/V/D/CP/dyspnea/dizziness/palpitations She has been using OTC Acetaminophen and Ibuprofen  Patient Care Team    Relationship Specialty Notifications Start End  Julaine Fusi, NP PCP - General Family Medicine  04/27/16     Patient Active Problem List   Diagnosis Date Noted  . Sore throat 01/16/2017  . Fever and chills 01/16/2017  . Fatigue 04/09/2016  . Health care maintenance 04/09/2016  . S/P repeat low transverse C-section 07/06/2014     Past Medical History:  Diagnosis Date  . Heartburn during pregnancy   . Medical history non-contributory      Past Surgical History:  Procedure Laterality Date  . CESAREAN SECTION  06/08/2011   Procedure: CESAREAN SECTION;  Surgeon: Bing Plume, MD;  Location: [facility];  Service: Gynecology;  Laterality: N/A;  . CESAREAN SECTION N/A 07/06/2014   Procedure: CESAREAN SECTION;  Surgeon: Huel Cote, MD;  Location: [facility];  Service: Obstetrics;  Laterality: N/A;  . WISDOM TOOTH EXTRACTION       Family History  Problem Relation Age of Onset  . Depression Mother   . Hyperlipidemia Mother   . Hypertension Father   . Cancer Maternal Grandmother        bone, lung  . Diabetes Maternal Grandmother   . Cancer Paternal Grandfather        lung, prostate     Social History   Substance and Sexual Activity  Drug Use No     Social History   Substance and Sexual Activity  Alcohol Use Yes  . Alcohol/week: 2.0 standard drinks  . Types: 1 Glasses of wine, 1 Cans of beer per week     Social History   Tobacco Use  Smoking Status Never Smoker  Smokeless Tobacco Never Used     Outpatient Encounter Medications as of 10/23/2017  Medication Sig  . ibuprofen  (ADVIL,MOTRIN) 600 MG tablet Take 1 tablet (600 mg total) by mouth every 6 (six) hours as needed for mild pain.  . Multiple Vitamin (MULTIVITAMIN) tablet Take 1 tablet by mouth daily.  . [DISCONTINUED] azithromycin (ZITHROMAX) 250 MG tablet 2 tabs day one, 1 tab daily days two-five.   No facility-administered encounter medications on file as of 10/23/2017.     Allergies: Penicillins  Body mass index is 26.28 kg/m.  Blood pressure 102/65, pulse 78, temperature 98.7 F (37.1 C), temperature source Oral, height 5\' 4"  (1.626 m), weight 153 lb 1.6 oz (69.4 kg), last menstrual period 09/30/2017, SpO2 99 %.  Review of Systems  Constitutional: Positive for fatigue. Negative for activity change, appetite change, chills, diaphoresis, fever and unexpected weight change.  HENT: Positive for postnasal drip, sore throat and voice change. Negative for congestion.   Respiratory: Negative for cough, chest tightness, shortness of breath, wheezing and stridor.   Cardiovascular: Negative for chest pain, palpitations and leg swelling.  Gastrointestinal: Negative for abdominal distention, abdominal pain, blood in stool, constipation, diarrhea, nausea and vomiting.  Endocrine: Negative for cold intolerance, heat intolerance, polydipsia, polyphagia and polyuria.  Neurological: Negative for dizziness and headaches.  Hematological: Does not bruise/bleed easily.  Psychiatric/Behavioral: Positive for sleep disturbance.       Objective:   Physical Exam  Constitutional: She is oriented to  person, place, and time. She appears well-developed and well-nourished.  Non-toxic appearance. She does not appear ill. No distress.  HENT:  Head: Normocephalic and atraumatic.  Right Ear: Hearing, tympanic membrane and ear canal normal.  Left Ear: Hearing, tympanic membrane and ear canal normal.  Mouth/Throat: Uvula is midline. No oral lesions. No uvula swelling. Oropharyngeal exudate, posterior oropharyngeal edema and  posterior oropharyngeal erythema present. No tonsillar abscesses. Tonsils are 2+ on the right. Tonsils are 2+ on the left. No tonsillar exudate.  Eyes: Pupils are equal, round, and reactive to light. EOM are normal.  Neck: Normal range of motion. Neck supple.  Cardiovascular: Normal rate, regular rhythm, normal heart sounds and intact distal pulses.  No murmur heard. Pulmonary/Chest: Effort normal and breath sounds normal. No stridor. No respiratory distress. She has no wheezes. She has no rhonchi. She has no rales. She exhibits no tenderness.  Lymphadenopathy:    She has no cervical adenopathy.  Neurological: She is alert and oriented to person, place, and time.  Skin: Skin is warm and dry. Capillary refill takes less than 2 seconds. No erythema.  Psychiatric: She has a normal mood and affect. Her behavior is normal.  Nursing note and vitals reviewed.     Assessment & Plan:   1. Sore throat   2. Fever, unspecified fever cause     Sore throat Strep Test + Prednisone taper PCN Allergy-Azithromycin rx given Work Excuse provided, okay to return Monday 10/28/17    FOLLOW-UP:  Return if symptoms worsen or fail to improve.

## 2017-10-23 NOTE — Patient Instructions (Signed)
Strep Throat Strep throat is a bacterial infection of the throat. Your health care provider may call the infection tonsillitis or pharyngitis, depending on whether there is swelling in the tonsils or at the back of the throat. Strep throat is most common during the cold months of the year in children who are 5-35 years of age, but it can happen during any season in people of any age. This infection is spread from person to person (contagious) through coughing, sneezing, or close contact. What are the causes? Strep throat is caused by the bacteria called Streptococcus pyogenes. What increases the risk? This condition is more likely to develop in:  People who spend time in crowded places where the infection can spread easily.  People who have close contact with someone who has strep throat.  What are the signs or symptoms? Symptoms of this condition include:  Fever or chills.  Redness, swelling, or pain in the tonsils or throat.  Pain or difficulty when swallowing.  White or yellow spots on the tonsils or throat.  Swollen, tender glands in the neck or under the jaw.  Red rash all over the body (rare).  How is this diagnosed? This condition is diagnosed by performing a rapid strep test or by taking a swab of your throat (throat culture test). Results from a rapid strep test are usually ready in a few minutes, but throat culture test results are available after one or two days. How is this treated? This condition is treated with antibiotic medicine. Follow these instructions at home: Medicines  Take over-the-counter and prescription medicines only as told by your health care provider.  Take your antibiotic as told by your health care provider. Do not stop taking the antibiotic even if you start to feel better.  Have family members who also have a sore throat or fever tested for strep throat. They may need antibiotics if they have the strep infection. Eating and drinking  Do not  share food, drinking cups, or personal items that could cause the infection to spread to other people.  If swallowing is difficult, try eating soft foods until your sore throat feels better.  Drink enough fluid to keep your urine clear or pale yellow. General instructions  Gargle with a salt-water mixture 3-4 times per day or as needed. To make a salt-water mixture, completely dissolve -1 tsp of salt in 1 cup of warm water.  Make sure that all household members wash their hands well.  Get plenty of rest.  Stay home from school or work until you have been taking antibiotics for 24 hours.  Keep all follow-up visits as told by your health care provider. This is important. Contact a health care provider if:  The glands in your neck continue to get bigger.  You develop a rash, cough, or earache.  You cough up a thick liquid that is green, yellow-brown, or bloody.  You have pain or discomfort that does not get better with medicine.  Your problems seem to be getting worse rather than better.  You have a fever. Get help right away if:  You have new symptoms, such as vomiting, severe headache, stiff or painful neck, chest pain, or shortness of breath.  You have severe throat pain, drooling, or changes in your voice.  You have swelling of the neck, or the skin on the neck becomes red and tender.  You have signs of dehydration, such as fatigue, dry mouth, and decreased urination.  You become increasingly sleepy, or   you cannot wake up completely.  Your joints become red or painful. This information is not intended to replace advice given to you by your health care provider. Make sure you discuss any questions you have with your health care provider. Document Released: 12/16/1999 Document Revised: 08/17/2015 Document Reviewed: 04/12/2014 Elsevier Interactive Patient Education  2018 [employer].   Strep Test Positive Prednisone taper sent in. PCN Allergy-Azithromycin  prescription sent in Work Excuse provided, okay to return Monday 10/28/17. If symptoms persist after antibiotics completed, then call clinic. FEEL BETTER!

## 2017-10-23 NOTE — Assessment & Plan Note (Signed)
Strep Test + Prednisone taper PCN Allergy-Azithromycin rx given Work Excuse provided, okay to return Monday 10/28/17

## 2017-12-23 ENCOUNTER — Ambulatory Visit (INDEPENDENT_AMBULATORY_CARE_PROVIDER_SITE_OTHER): Payer: BC Managed Care – PPO | Admitting: Adult Health

## 2017-12-23 ENCOUNTER — Encounter: Payer: Self-pay | Admitting: Adult Health

## 2017-12-23 VITALS — BP 94/66 | HR 99 | Temp 99.1°F | Ht 64.0 in | Wt 151.2 lb

## 2017-12-23 DIAGNOSIS — J029 Acute pharyngitis, unspecified: Secondary | ICD-10-CM | POA: Diagnosis not present

## 2017-12-23 DIAGNOSIS — R6889 Other general symptoms and signs: Secondary | ICD-10-CM | POA: Diagnosis not present

## 2017-12-23 LAB — POCT RAPID STREP A (OFFICE): RAPID STREP A SCREEN: NEGATIVE

## 2017-12-23 LAB — POCT INFLUENZA A/B
INFLUENZA A, POC: NEGATIVE
INFLUENZA B, POC: NEGATIVE

## 2017-12-23 MED ORDER — HYDROCOD POLST-CPM POLST ER 10-8 MG/5ML PO SUER: 5.0000 mL | Freq: Two times a day (BID) | ORAL | 0 refills | Status: DC | PRN

## 2017-12-23 NOTE — Assessment & Plan Note (Signed)
Flu test Negative Strep test Negative  Please use Tussionex as needed. Increase fluids, rest, vit c-2,000mg /day when not feeling well. Alternate OTC Acetaminophen and OTC Ibuprofen as needed for fever/discomfort.

## 2017-12-23 NOTE — Patient Instructions (Signed)
Cough, Adult  A cough helps to clear your throat and lungs. A cough may last only 2-3 weeks (acute), or it may last longer than 8 weeks (chronic). Many different things can cause a cough. A cough may be a sign of an illness or another medical condition. Follow these instructions at home:  Pay attention to any changes in your cough.  Take medicines only as told by your doctor. ? If you were prescribed an antibiotic medicine, take it as told by your doctor. Do not stop taking it even if you start to feel better. ? Talk with your doctor before you try using a cough medicine.  Drink enough fluid to keep your pee (urine) clear or pale yellow.  If the air is dry, use a cold steam vaporizer or humidifier in your home.  Stay away from things that make you cough at work or at home.  If your cough is worse at night, try using extra pillows to raise your head up higher while you sleep.  Do not smoke, and try not to be around smoke. If you need help quitting, ask your doctor.  Do not have caffeine.  Do not drink alcohol.  Rest as needed. Contact a doctor if:  You have new problems (symptoms).  You cough up yellow fluid (pus).  Your cough does not get better after 2-3 weeks, or your cough gets worse.  Medicine does not help your cough and you are not sleeping well.  You have pain that gets worse or pain that is not helped with medicine.  You have a fever.  You are losing weight and you do not know why.  You have night sweats. Get help right away if:  You cough up blood.  You have trouble breathing.  Your heartbeat is very fast. This information is not intended to replace advice given to you by your health care provider. Make sure you discuss any questions you have with your health care provider. Document Released: 08/31/2010 Document Revised: 05/26/2015 Document Reviewed: 02/24/2014 Elsevier Interactive Patient Education  2019 [employer]  Flu test Negative Strep test  Negative  Please use Tussionex as needed. Increase fluids, rest, vit c-2,000mg /day when not feeling well. Alternate OTC Acetaminophen and OTC Ibuprofen as needed for fever/discomfort. FEEL BETTER!

## 2017-12-23 NOTE — Assessment & Plan Note (Signed)
[place] Controlled Substance Database reviewed- no aberrancies noted Flu test Negative Strep test Negative  Please use Tussionex as needed. Increase fluids, rest, vit c-2,000mg /day when not feeling well. Alternate OTC Acetaminophen and OTC Ibuprofen as needed for fever/discomfort.

## 2017-12-23 NOTE — Progress Notes (Signed)
Subjective:    Patient ID: Tammie Atkinson, female    DOB: [birth year], 37 y.o.   MRN: [id number]  HPI:  Tammie Atkinson presents with sudden onset of fever (highest 102), fatigue, productive cough (thick/yellow mucus), chills, fatigue, frontal HA (3/10), and sore throat (3/10) that started at 0300 this morning. She has been pushing fluids and using OTC Acetaminophen (last dose 2 hrs ago, current temp 4851f oral temp), and OTC Elderberry. 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 She denies tobacco/vape use She denies CP/dyspnea/dizziness/palpitations/N/V/D  Patient Care Team    Relationship Specialty Notifications Start End  Julaine Fusianford, Katy D, NP PCP - General Family Medicine  04/27/16     Patient Active Problem List   Diagnosis Date Noted  . Sore throat 01/16/2017  . Fever and chills 01/16/2017  . Fatigue 04/09/2016  . Health care maintenance 04/09/2016  . S/P repeat low transverse C-section 07/06/2014     Past Medical History:  Diagnosis Date  . Heartburn during pregnancy   . Medical history non-contributory      Past Surgical History:  Procedure Laterality Date  . CESAREAN SECTION  06/08/2011   Procedure: CESAREAN SECTION;  Surgeon: Bing Plumehomas F Henley, MD;  Location: [facility];  Service: Gynecology;  Laterality: N/A;  . CESAREAN SECTION N/A 07/06/2014   Procedure: CESAREAN SECTION;  Surgeon: Huel CoteKathy Richardson, MD;  Location: [facility];  Service: Obstetrics;  Laterality: N/A;  . WISDOM TOOTH EXTRACTION       Family History  Problem Relation Age of Onset  . Depression Mother   . Hyperlipidemia Mother   . Hypertension Father   . Cancer Maternal Grandmother        bone, lung  . Diabetes Maternal Grandmother   . Cancer Paternal Grandfather        lung, prostate     Social History   Substance and Sexual Activity  Drug Use No     Social History   Substance and Sexual Activity  Alcohol Use Yes  . Alcohol/week: 2.0 standard drinks  . Types:  1 Glasses of wine, 1 Cans of beer per week     Social History   Tobacco Use  Smoking Status Never Smoker  Smokeless Tobacco Never Used     Outpatient Encounter Medications as of 12/23/2017  Medication Sig  . ibuprofen (ADVIL,MOTRIN) 600 MG tablet Take 1 tablet (600 mg total) by mouth every 6 (six) hours as needed for mild pain.  . Multiple Vitamin (MULTIVITAMIN) tablet Take 1 tablet by mouth daily.  . [DISCONTINUED] azithromycin (ZITHROMAX) 250 MG tablet 2 tabs day one, 1 tab daily days two-five.  . [DISCONTINUED] predniSONE (DELTASONE) 20 MG tablet 1 tab every 12 hrs for 3 days then 1 tab daily for 3 days   No facility-administered encounter medications on file as of 12/23/2017.     Allergies: Penicillins  Body mass index is 25.95 kg/m.  Blood pressure 94/66, pulse 99, temperature 99.1 F (37.3 C), temperature source Oral, height 5\' 4"  (1.626 m), weight 151 lb 3.2 oz (68.6 kg), last menstrual period 11/27/2017, SpO2 97 %.     Review of Systems  Constitutional: Positive for activity change, appetite change, chills, diaphoresis, fatigue and fever. Negative for unexpected weight change.  HENT: Positive for postnasal drip, rhinorrhea, sinus pressure, sore throat and voice change. Negative for congestion and sinus pain.   Eyes: Negative for visual disturbance.  Respiratory: Positive for cough. Negative for chest tightness, shortness of breath and  wheezing.   Cardiovascular: Negative for chest pain, palpitations and leg swelling.  Gastrointestinal: Negative for abdominal distention, abdominal pain, blood in stool, constipation, diarrhea, nausea and vomiting.  Endocrine: Negative for cold intolerance, heat intolerance, polydipsia, polyphagia and polyuria.  Skin: Negative for color change, pallor, rash and wound.  Neurological: Positive for headaches. Negative for dizziness.  Psychiatric/Behavioral: Positive for sleep disturbance. Negative for behavioral problems, confusion,  decreased concentration, dysphoric mood, hallucinations, self-injury and suicidal ideas. The patient is not nervous/anxious and is not hyperactive.        Objective:   Physical Exam Constitutional:      General: She is not in acute distress.    Appearance: She is normal weight. She is not ill-appearing, toxic-appearing or diaphoretic.  HENT:     Head: Normocephalic and atraumatic.     Right Ear: Tympanic membrane normal.     Left Ear: Tympanic membrane normal.     Nose: Congestion and rhinorrhea present.     Mouth/Throat:     Mouth: Mucous membranes are moist. No oral lesions.     Pharynx: Posterior oropharyngeal erythema present. No pharyngeal swelling, oropharyngeal exudate or uvula swelling.     Tonsils: Swelling: 1+ on the right. 1+ on the left.  Eyes:     Extraocular Movements:     Right eye: Normal extraocular motion.     Left eye: Normal extraocular motion.     Conjunctiva/sclera: Conjunctivae normal.     Pupils: Pupils are equal, round, and reactive to light.  Neck:     Musculoskeletal: Normal range of motion and neck supple.  Cardiovascular:     Rate and Rhythm: Normal rate and regular rhythm.     Heart sounds: Normal heart sounds. No murmur. No friction rub. No gallop.   Pulmonary:     Effort: Pulmonary effort is normal. No respiratory distress.     Breath sounds: Normal breath sounds. No stridor. No wheezing, rhonchi or rales.  Chest:     Chest wall: No tenderness.  Lymphadenopathy:     Cervical: No cervical adenopathy.  Skin:    General: Skin is dry.     Capillary Refill: Capillary refill takes less than 2 seconds.     Coloration: Skin is not pale.     Findings: No erythema or rash.  Neurological:     General: No focal deficit present.     Mental Status: She is alert and oriented to person, place, and time.  Psychiatric:        Mood and Affect: Mood normal.        Behavior: Behavior normal.        Assessment & Plan:   1. Sore throat   2. Flu-like  symptoms     Sore throat Flu test Negative Strep test Negative  Please use Tussionex as needed. Increase fluids, rest, vit c-2,000mg /day when not feeling well. Alternate OTC Acetaminophen and OTC Ibuprofen as needed for fever/discomfort.  Flu-like symptoms [place] Cont[place] Database reviewed- no aberrancies noted Flu test Negative Strep test Negative  Please use Tussionex as needed. Increase fluids, rest, vit c-2,000mg /day when not feeling well. Alternate OTC Acetaminophen and OTC Ibuprofen as needed for fever/discomfort.    FOLLOW-UP:  Return if symptoms worsen or fail to improve.

## 2017-12-23 NOTE — Progress Notes (Signed)
c 

## 2017-12-26 ENCOUNTER — Encounter: Payer: Self-pay | Admitting: Adult Health

## 2017-12-26 ENCOUNTER — Ambulatory Visit (INDEPENDENT_AMBULATORY_CARE_PROVIDER_SITE_OTHER): Payer: BC Managed Care – PPO | Admitting: Adult Health

## 2017-12-26 VITALS — BP 112/65 | HR 103 | Temp 99.3°F | Ht 64.0 in | Wt 149.7 lb

## 2017-12-26 DIAGNOSIS — Z0000 Encounter for general adult medical examination without abnormal findings: Secondary | ICD-10-CM

## 2017-12-26 DIAGNOSIS — R6889 Other general symptoms and signs: Secondary | ICD-10-CM

## 2017-12-26 MED ORDER — FLUTICASONE PROPIONATE 50 MCG/ACT NA SUSP: 2.0000 | Freq: Every day | NASAL | 2 refills | Status: AC

## 2017-12-26 MED ORDER — OSELTAMIVIR PHOSPHATE 75 MG PO CAPS: 75.0000 mg | ORAL_CAPSULE | Freq: Two times a day (BID) | ORAL | 0 refills | Status: AC

## 2017-12-26 MED ORDER — HYDROCOD POLST-CPM POLST ER 10-8 MG/5ML PO SUER: 5.0000 mL | Freq: Two times a day (BID) | ORAL | 0 refills | Status: AC | PRN

## 2017-12-26 NOTE — Progress Notes (Addendum)
Subjective:    Patient ID: Tammie Atkinson, female    DOB: Jan 13, 1980, 37 y.o.   MRN: [id number]  HPI:  Ms. Tammie Atkinson presents with continued high grade fever- highest 103 She has also had nasal drainage (brown/green), productive cough (brown mucus), fatigue, and loose stools the last two days. She denies abd pain She denies N/V She denies hematuria/hematochezia Wt is stable She denies urinary frequency/dysuria  She reports that her daughter had the flu over the weekend She has been alternating OTC Ibuprofen and Acetaminophen, last dose was 4 hrs ago Current temp 99.8177f oral temp She was seen 12/23/17 for fever- neg flu and strep test   Patient Care Team    Relationship Specialty Notifications Start End  Julaine Fusianford, Katy D, NP PCP - General Family Medicine  04/27/16     Patient Active Problem List   Diagnosis Date Noted  . Flu-like symptoms 12/23/2017  . Sore throat 01/16/2017  . Fever and chills 01/16/2017  . Fatigue 04/09/2016  . Health care maintenance 04/09/2016  . S/P repeat low transverse C-section 07/06/2014     Past Medical History:  Diagnosis Date  . Heartburn during pregnancy   . Medical history non-contributory      Past Surgical History:  Procedure Laterality Date  . CESAREAN SECTION  06/08/2011   Procedure: CESAREAN SECTION;  Surgeon: Bing Plumehomas F Henley, MD;  Location: [facility];  Service: Gynecology;  Laterality: N/A;  . CESAREAN SECTION N/A 07/06/2014   Procedure: CESAREAN SECTION;  Surgeon: Huel CoteKathy Richardson, MD;  Location: [facility];  Service: Obstetrics;  Laterality: N/A;  . WISDOM TOOTH EXTRACTION       Family History  Problem Relation Age of Onset  . Depression Mother   . Hyperlipidemia Mother   . Hypertension Father   . Cancer Maternal Grandmother        bone, lung  . Diabetes Maternal Grandmother   . Cancer Paternal Grandfather        lung, prostate     Social History   Substance and Sexual Activity  Drug Use No     Social History   Substance  and Sexual Activity  Alcohol Use Yes  . Alcohol/week: 2.0 standard drinks  . Types: 1 Glasses of wine, 1 Cans of beer per week     Social History   Tobacco Use  Smoking Status Never Smoker  Smokeless Tobacco Never Used     Outpatient Encounter Medications as of 12/26/2017  Medication Sig  . ibuprofen (ADVIL,MOTRIN) 600 MG tablet Take 1 tablet (600 mg total) by mouth every 6 (six) hours as needed for mild pain.  . Multiple Vitamin (MULTIVITAMIN) tablet Take 1 tablet by mouth daily.  . [DISCONTINUED] chlorpheniramine-HYDROcodone (TUSSIONEX) 10-8 MG/5ML SUER Take 5 mLs by mouth every 12 (twelve) hours as needed.  . chlorpheniramine-HYDROcodone (TUSSIONEX) 10-8 MG/5ML SUER Take 5 mLs by mouth every 12 (twelve) hours as needed.  . fluticasone (FLONASE) 50 MCG/ACT nasal spray Place 2 sprays into both nostrils daily.  Marland Kitchen. oseltamivir (TAMIFLU) 75 MG capsule Take 1 capsule (75 mg total) by mouth 2 (two) times daily.   No facility-administered encounter medications on file as of 12/26/2017.     Allergies: Penicillins  Body mass index is 25.7 kg/m.  Blood pressure 112/65, pulse (!) 103, temperature 99.3 F (37.4 C), temperature source Oral, height 5\' 4"  (1.626 m), weight 149 lb 11.2 oz (67.9 kg), last menstrual period 12/25/2017, SpO2 97 %.  Review of Systems  Constitutional: Positive for activity change, appetite  change, chills, diaphoresis, fatigue and fever. Negative for unexpected weight change.  HENT: Positive for congestion, postnasal drip, rhinorrhea, sinus pressure, sinus pain, sneezing, sore throat and voice change.   Eyes: Negative for visual disturbance.  Respiratory: Positive for cough. Negative for chest tightness, shortness of breath, wheezing and stridor.   Cardiovascular: Negative for chest pain, palpitations and leg swelling.  Gastrointestinal: Positive for diarrhea. Negative for abdominal distention, blood in stool, constipation, nausea and vomiting.  Genitourinary:  Negative for difficulty urinating, flank pain and hematuria.  Musculoskeletal: Positive for myalgias.  Neurological: Positive for headaches. Negative for dizziness.  Hematological: Does not bruise/bleed easily.  Psychiatric/Behavioral: Positive for sleep disturbance. Negative for agitation, behavioral problems, confusion, decreased concentration, dysphoric mood, hallucinations, self-injury and suicidal ideas. The patient is not nervous/anxious and is not hyperactive.        Objective:   Physical Exam Vitals signs and nursing note reviewed.  Constitutional:      Appearance: She is normal weight. She is ill-appearing and toxic-appearing.  HENT:     Head: Normocephalic and atraumatic.     Right Ear: Tympanic membrane normal.     Left Ear: Tympanic membrane normal.     Nose: Congestion and rhinorrhea present.     Mouth/Throat:     Mouth: Mucous membranes are dry.     Pharynx: Posterior oropharyngeal erythema present.  Eyes:     Extraocular Movements: Extraocular movements intact.     Conjunctiva/sclera: Conjunctivae normal.     Pupils: Pupils are equal, round, and reactive to light.  Neck:     Musculoskeletal: Normal range of motion.  Cardiovascular:     Rate and Rhythm: Tachycardia present.     Pulses: Normal pulses.     Heart sounds: Normal heart sounds. No murmur. No friction rub. No gallop.   Pulmonary:     Effort: Pulmonary effort is normal. No respiratory distress.     Breath sounds: Normal breath sounds. No stridor. No wheezing, rhonchi or rales.  Chest:     Chest wall: No tenderness.  Abdominal:     General: Abdomen is flat. Bowel sounds are normal. There is no distension.     Palpations: There is no mass.     Tenderness: There is no abdominal tenderness. There is no right CVA tenderness, left CVA tenderness, guarding or rebound. Negative signs include Murphy's sign, Rovsing's sign, McBurney's sign, psoas sign and obturator sign.     Hernia: No hernia is present.   Musculoskeletal: Normal range of motion.  Skin:    General: Skin is dry.     Capillary Refill: Capillary refill takes less than 2 seconds.  Neurological:     Mental Status: She is oriented to person, place, and time.  Psychiatric:        Mood and Affect: Mood normal.        Behavior: Behavior normal.        Thought Content: Thought content normal.        Judgment: Judgment normal.       Assessment & Plan:   1. Healthcare maintenance   2. Flu-like symptoms     Flu-like symptoms [place] Controlled Substance Database reviewed- no aberrancies noted. Please take [place] as directed, Tussionex and Flonase as needed. Push fluids/rest/vit c-2,000mg /day Alternate OTC Acetaminophen and Ibuprofen as needed for fever/discomfort.    FOLLOW-UP:  Return if symptoms worsen or fail to improve.

## 2017-12-26 NOTE — Patient Instructions (Signed)

## 2017-12-26 NOTE — Assessment & Plan Note (Signed)
[place] Controlled Substance Database reviewed- no aberrancies noted. Please take [place] as directed, Tussionex and Flonase as needed. Push fluids/rest/vit c-2,000mg /day Alternate OTC Acetaminophen and Ibuprofen as needed for fever/discomfort.

## 2019-01-09 ENCOUNTER — Other Ambulatory Visit: Payer: BC Managed Care – PPO

## 2019-01-09 ENCOUNTER — Ambulatory Visit: Payer: BC Managed Care – PPO | Attending: Internal Medicine

## 2019-01-09 DIAGNOSIS — Z20822 Contact with and (suspected) exposure to covid-19: Secondary | ICD-10-CM

## 2019-01-11 LAB — NOVEL CORONAVIRUS, NAA: SARS-CoV-2, NAA: DETECTED — AB

## 2020-02-01 ENCOUNTER — Telehealth: Payer: Self-pay | Admitting: Physician Assistant

## 2020-02-12 NOTE — Telephone Encounter (Signed)
Started phone note in error  

## 2022-08-17 ENCOUNTER — Other Ambulatory Visit: Payer: Self-pay | Admitting: Gynecology

## 2022-08-17 DIAGNOSIS — R928 Other abnormal and inconclusive findings on diagnostic imaging of breast: Secondary | ICD-10-CM
